# Patient Record
Sex: Female | Born: 1945 | Race: White | Hispanic: No | Marital: Married | Smoking: Never smoker
Health system: Southern US, Community
[De-identification: ages and names within clinical notes are randomized; demographics above are authoritative.]

## PROBLEM LIST (undated history)

## (undated) DIAGNOSIS — E7800 Pure hypercholesterolemia, unspecified: Secondary | ICD-10-CM

## (undated) DIAGNOSIS — I10 Essential (primary) hypertension: Secondary | ICD-10-CM

## (undated) DIAGNOSIS — R011 Cardiac murmur, unspecified: Secondary | ICD-10-CM

## (undated) HISTORY — PX: APPENDECTOMY: SHX54

## (undated) HISTORY — PX: ROTATOR CUFF REPAIR: SHX139

---

## 2012-03-21 ENCOUNTER — Encounter (HOSPITAL_BASED_OUTPATIENT_CLINIC_OR_DEPARTMENT_OTHER): Payer: Self-pay

## 2012-03-21 ENCOUNTER — Emergency Department (HOSPITAL_BASED_OUTPATIENT_CLINIC_OR_DEPARTMENT_OTHER)
Admission: EM | Admit: 2012-03-21 | Discharge: 2012-03-21 | Disposition: A | Discharge status: Home / Self Care | Payer: Medicare Other | Attending: Emergency Medicine | Admitting: Emergency Medicine

## 2012-03-21 DIAGNOSIS — Z79899 Other long term (current) drug therapy: Secondary | ICD-10-CM | POA: Insufficient documentation

## 2012-03-21 DIAGNOSIS — E7800 Pure hypercholesterolemia, unspecified: Secondary | ICD-10-CM | POA: Insufficient documentation

## 2012-03-21 DIAGNOSIS — M810 Age-related osteoporosis without current pathological fracture: Secondary | ICD-10-CM | POA: Insufficient documentation

## 2012-03-21 DIAGNOSIS — N390 Urinary tract infection, site not specified: Secondary | ICD-10-CM

## 2012-03-21 DIAGNOSIS — I10 Essential (primary) hypertension: Secondary | ICD-10-CM | POA: Insufficient documentation

## 2012-03-21 DIAGNOSIS — Z9889 Other specified postprocedural states: Secondary | ICD-10-CM | POA: Insufficient documentation

## 2012-03-21 DIAGNOSIS — R197 Diarrhea, unspecified: Secondary | ICD-10-CM | POA: Insufficient documentation

## 2012-03-21 HISTORY — DX: Cardiac murmur, unspecified: R01.1

## 2012-03-21 HISTORY — DX: Essential (primary) hypertension: I10

## 2012-03-21 HISTORY — DX: Pure hypercholesterolemia, unspecified: E78.00

## 2012-03-21 LAB — COMPREHENSIVE METABOLIC PANEL
ALT: 47 U/L — ABNORMAL HIGH (ref 0–35)
CO2: 25 mEq/L (ref 19–32)
Calcium: 9 mg/dL (ref 8.4–10.5)
Creatinine, Ser: 0.7 mg/dL (ref 0.50–1.10)
GFR calc Af Amer: >90 mL/min (ref >90)
GFR calc non Af Amer: 88 mL/min — ABNORMAL LOW (ref >90)
Glucose, Bld: 102 mg/dL — ABNORMAL HIGH (ref 70–99)
Total Bilirubin: 0.4 mg/dL (ref 0.3–1.2)

## 2012-03-21 LAB — URINALYSIS, ROUTINE W REFLEX MICROSCOPIC
Bilirubin Urine: NEGATIVE
Protein, ur: NEGATIVE mg/dL
Urobilinogen, UA: 0.2 mg/dL (ref 0.0–1.0)

## 2012-03-21 LAB — CBC
HCT: 40.3 % (ref 36.0–46.0)
Hemoglobin: 14.1 g/dL (ref 12.0–15.0)
MCH: 30.7 pg (ref 26.0–34.0)
MCHC: 35 g/dL (ref 30.0–36.0)

## 2012-03-21 LAB — DIFFERENTIAL
Basophils Relative: 1 % (ref 0–1)
Eosinophils Absolute: 0 10*3/uL (ref 0.0–0.7)
Eosinophils Relative: 0 % (ref 0–5)
Monocytes Absolute: 0.3 10*3/uL (ref 0.1–1.0)
Monocytes Relative: 7 % (ref 3–12)

## 2012-03-21 LAB — URINE MICROSCOPIC-ADD ON

## 2012-03-21 MED ORDER — SODIUM CHLORIDE 0.9 % IV BOLUS (SEPSIS)
1000.0000 mL | Freq: Once | INTRAVENOUS | Status: AC
  Administered 2012-03-21 (×2): 1000 mL via INTRAVENOUS

## 2012-03-21 MED ORDER — SODIUM CHLORIDE 0.9 % IV BOLUS (SEPSIS)
1000.0000 mL | Freq: Once | INTRAVENOUS | Status: AC
  Administered 2012-03-21: 1000 mL via INTRAVENOUS

## 2012-03-21 MED ORDER — CIPROFLOXACIN HCL 500 MG PO TABS
500.0000 mg | ORAL_TABLET | Freq: Once | ORAL | Status: AC
  Administered 2012-03-21: 500 mg via ORAL
  Filled 2012-03-21: qty 1

## 2012-03-21 MED ORDER — ACETAMINOPHEN 500 MG PO TABS
1000.0000 mg | ORAL_TABLET | Freq: Once | ORAL | Status: AC
  Administered 2012-03-21: 1000 mg via ORAL
  Filled 2012-03-21: qty 2

## 2012-03-21 MED ORDER — CIPROFLOXACIN HCL 500 MG PO TABS: 500.0000 mg | ORAL_TABLET | Freq: Two times a day (BID) | ORAL | Status: AC

## 2012-03-21 MED ORDER — ONDANSETRON HCL 4 MG/2ML IJ SOLN
4.0000 mg | Freq: Once | INTRAMUSCULAR | Status: AC
  Administered 2012-03-21: 4 mg via INTRAVENOUS
  Filled 2012-03-21: qty 2

## 2012-03-21 NOTE — Discharge Instructions (Signed)
Chronic Diarrhea Diarrhea is loose, watery stools. Having diarrhea means passing loose stools 3 or more times a day. Diarrhea that lasts longer than 4 weeks is considered long-lasting (chronic). Symptoms of chronic diarrhea may be continual or may come and go. People of all ages can get diarrhea. Body fluid loss (dehydration) may occur as a result of diarrhea. This means the body does not have as many fluids and salts (electrolytes) as it needs. CAUSES  There are many causes of chronic diarrhea. Causes may be different for children and adults. The various causes can be grouped into 2 categories: diarrhea caused by an infection and diarrhea not caused by an infection. Sometimes, the cause is unknown. Diarrhea caused by an infection may result from:  Parasites.   Bacteria.   Viral infections.  Diarrhea not caused by an infection may result from:  Irritable bowel syndrome.   Reaction to medicines, such as antibiotics, cancer drugs, blood pressure medicines, and antacids.   Intestinal disease (Crohn's disease, ulcerative colitis, celiac disease).   Food allergies or sensitivity to additives (fructose, lactose, sugar substitutes).   Tumors.   Diabetes, thyroid disease, and other endocrine diseases.   Reduced blood flow to the intestine.   Previous surgery or radiation of the abdomen or gastrointestinal tract.  Risk factors for chronic diarrhea include:  Having a severely weakened immune system, such as from HIV/AIDS.   Taking certain types of cancer-fighting drugs (chemotherapy) or other medicines.   A recent organ transplant.   Having a portion of the stomach removed.   Traveling to countries where food and water supplies are often contaminated.  SYMPTOMS  In addition to frequent, loose stools, diarrhea may cause:  Cramping.   Abdominal pain.   Nausea.   Urgent need to use the bathroom, or loss of bowel control.  If dehydration occurs, problems include:  Thirst.    Less frequent urination.   Dark urine.   Dry skin.   Fatigue.   Dizziness.  Infections that cause diarrhea may also cause a fever, chills, or bloody stools. DIAGNOSIS  Diagnosis may be difficult. Your caregiver must take a careful history and perform a physical exam. Tests given are based on your symptoms and history. Tests may include:  Blood or stool tests, in which 3 or more stool samples may be examined. Stool cultures may be used to test for bacteria or parasites.   X-rays.   A procedure in which a thin tube is inserted into the mouth or rectum (endoscopy). This allows the caregiver to look inside the intestine.  TREATMENT   Diarrhea caused by an infection can often be treated with antibiotics.   Diarrhea not caused by an infection is more difficult to diagnose and treat. Long-term medicine use or surgery may be required. Specific treatment should be discussed with your caregiver.   If the cause cannot determined, treatment to relieve symptoms includes:   Preventing dehydration. Serious health problems can occur if you do not maintain proper fluid levels. Many oral rehydration solutions (ORS) are available at drug stores. Ask your caregiver what product is best for you.   Not drinking beverages that contain caffeine (tea, coffee, soft drinks).   Not drinking alcohol. It causes dehydration.   Not relying on sports drinks and broths alone to maintain proper fluid levels. They should not be used to prevent severe dehydration.   Maintaining well-balanced nutrition. This may help you recover faster.  PREVENTION   Drink clean or purified water.   Use proper   food handling techniques.   Maintain proper hand-washing habits.  HOME CARE INSTRUCTIONS   Avoid:   Caffeine.   Greasy foods.   High fiber.   If you have problems digesting lactose during or after an episode of diarrhea, you might want to try yogurt. Yogurt is often better tolerated, because it has less  lactose than milk. Yogurt with active, live bacterial cultures may even help you recover faster.  SEEK MEDICAL CARE IF:  The person with diarrhea is an otherwise healthy adult and has:  Signs of dehydration.   Diarrhea for more than 2 days.   Severe pain in the abdomen or rectum.   An oral temperature above 102 F (38.9 C).   Stools containing blood or pus.   Stools that are black and tarry.  SEEK IMMEDIATE MEDICAL CARE IF:  The person with diarrhea is a child, elderly person, or has a weakened immune system and has:  Signs of dehydration.   Diarrhea for more than 1 day.   Severe pain in the abdomen or rectum.   An oral temperature above 102 F (38.9 C), not controlled by medicine.   Stools containing blood or pus.   Stools that are black and tarry.  Document Released: 02/19/2004 Document Revised: 11/18/2011 Document Reviewed: 04/17/2010 Exit[employer] Patient Information 2012 [employer]. 

## 2012-03-21 NOTE — ED Notes (Signed)
Per EDP, walked patient down the hall, retook BP 108/53. EDP notified

## 2012-03-21 NOTE — ED Notes (Signed)
Pt received 2000cc IV saline Not 3000cc as documented.

## 2012-03-21 NOTE — ED Notes (Signed)
Pt c/o diarrhea for past 5 weeks.  Pt states she saw [facility] and provided stool samples but hasn't heard back.  Pt states she tried Immodium but became constipated.  Pt contacted PCP and was told to come here to ED.

## 2012-03-21 NOTE — ED Notes (Signed)
Pt ambulatory to bathroom without difficulty.  

## 2012-03-21 NOTE — ED Notes (Signed)
C/o chronic diarrhea for 6 weeks. [facility] office told her to take imodium which made her constipated. Patient contacted [facility] gastroenterology office to give stool sample and took it to lab yesterday. Fever of  101 on Sunday and 102 today, weakness, fatigue, nausea started today.

## 2012-03-21 NOTE — ED Provider Notes (Signed)
History     CSN: [id number]  Arrival date & time 03/21/12  1722   First MD Initiated Contact with Patient 03/21/12 1750      Chief Complaint  Patient presents with  . Diarrhea    (Consider location/radiation/quality/duration/timing/severity/associated sxs/prior treatment) HPI The patient presents with concerns over ongoing diarrhea.  She notes that her symptoms began approximately 6 weeks ago.  She specifies that her change in stool has actually been a increasing frequency and a decrease in consistency with soft stool, but not watery diarrhea.  This happens up to 3 times daily, typically in the morning.  She has recently started trying Imodium, and notes a decrease in her symptoms.  Yesterday today the patient also had fever with maximum temperature 102.  His fever improved with OTC medication.,  The patient has been working with [facility] gastroenterology, and provided a stool sample yesterday.  She denies any significant abdominal pain, chest pain, dyspnea, confusion, disorientation.  She has no history of IBS/IBD/Crohn's.  Her daughter does have ulcerative colitis. Past Medical History  Diagnosis Date  . Osteoporosis   . Murmur, cardiac   . Hypertension   . Hypercholesteremia     Past Surgical History  Procedure Date  . Cesarean section   . Appendectomy   . Rotator cuff repair     No family history on file.  History  Substance Use Topics  . Smoking status: Never Smoker   . Smokeless tobacco: Not on file  . Alcohol Use: Yes    OB History    Grav Para Term Preterm Abortions TAB SAB Ect Mult Living                  Review of Systems  Constitutional:       HPI  HENT:       HPI otherwise negative  Eyes: Negative.   Respiratory:       HPI, otherwise negative  Cardiovascular:       HPI, otherwise nmegative  Gastrointestinal: Negative for vomiting.  Genitourinary:       HPI, otherwise negative  Musculoskeletal:       HPI, otherwise negative  Skin: Negative.     Neurological: Negative for syncope.    Allergies  Amoxicillin  Home Medications   Current Outpatient Rx  Name Route Sig Dispense Refill  . ALPRAZOLAM 0.5 MG PO TABS Oral Take 0.5 mg by mouth at bedtime as needed. For sleep    . ATENOLOL 25 MG PO TABS Oral Take 25 mg by mouth daily.    Marland Kitchen CALCIUM + D PO Oral Take 1 capsule by mouth 2 (two) times daily.    Marland Kitchen VITAMIN D 2000 UNITS PO TABS Oral Take 2,000 Units by mouth daily.    . CO Q-10 100 MG PO CAPS Oral Take 1 capsule by mouth daily.    . OMEGA-3 FATTY ACIDS 1000 MG PO CAPS Oral Take 1 g by mouth daily.    . LUTEIN PO Oral Take 1 tablet by mouth daily.    Marland Kitchen SIMVASTATIN 20 MG PO TABS Oral Take 20 mg by mouth at bedtime.    . TRAMADOL HCL 50 MG PO TABS Oral Take 50 mg by mouth 3 (three) times daily as needed. For pain or sleep      BP 127/55  Pulse 101  Temp(Src) 100.4 F (38 C) (Oral)  Resp 18  Ht 5\' 4"  (1.626 m)  Wt 105 lb (47.628 kg)  BMI 18.02 kg/m2  SpO2  100%  Physical Exam  Nursing note and vitals reviewed. Constitutional: She is oriented to person, place, and time. She appears well-developed and well-nourished. No distress.  HENT:  Head: Normocephalic and atraumatic.  Eyes: Conjunctivae and EOM are normal.  Cardiovascular: Normal rate and regular rhythm.   Pulmonary/Chest: Effort normal and breath sounds normal. No stridor. No respiratory distress.  Abdominal: She exhibits no distension.  Musculoskeletal: She exhibits no edema.  Neurological: She is alert and oriented to person, place, and time. No cranial nerve deficit.  Skin: Skin is warm and dry.  Psychiatric: She has a normal mood and affect.    ED Course  Procedures (including critical care time)  Labs Reviewed  CBC - Abnormal; Notable for the following:    WBC 3.5 (*)    All other components within normal limits  DIFFERENTIAL  URINALYSIS, ROUTINE W REFLEX MICROSCOPIC  LIPASE, BLOOD  COMPREHENSIVE METABOLIC PANEL   No results found.   No  diagnosis found.    MDM  This elderly female presents with chronic diarrhea.  On exam the patient is in no distress with soft abdomen and mild tachycardia.  Patient also and better with IV fluids.  The patient's labs notable for the absence of leukocytosis, though she is moderately febrile.  Patient's presentation is most consistent with IBS or colitis.  The patient's labs are otherwise notable for suggestions of a UTI, though this may be secondary to dehydration.  The patient felt better on repeat evaluation.  She is discharged in stable condition to follow up with her tremor care physician as well as her gastroenterologist.    Gerhard Munch, MD 03/21/12 2051

## 2014-08-26 DIAGNOSIS — M810 Age-related osteoporosis without current pathological fracture: Secondary | ICD-10-CM | POA: Insufficient documentation

## 2014-08-26 DIAGNOSIS — R000 Tachycardia, unspecified: Secondary | ICD-10-CM | POA: Insufficient documentation

## 2014-08-26 DIAGNOSIS — IMO0001 Reserved for inherently not codable concepts without codable children: Secondary | ICD-10-CM | POA: Insufficient documentation

## 2014-08-26 DIAGNOSIS — G629 Polyneuropathy, unspecified: Secondary | ICD-10-CM | POA: Insufficient documentation

## 2014-08-26 DIAGNOSIS — M545 Low back pain, unspecified: Secondary | ICD-10-CM | POA: Insufficient documentation

## 2014-08-26 DIAGNOSIS — F4323 Adjustment disorder with mixed anxiety and depressed mood: Secondary | ICD-10-CM | POA: Insufficient documentation

## 2014-08-26 DIAGNOSIS — M62830 Muscle spasm of back: Secondary | ICD-10-CM | POA: Insufficient documentation

## 2016-07-16 DIAGNOSIS — E785 Hyperlipidemia, unspecified: Secondary | ICD-10-CM | POA: Insufficient documentation

## 2016-08-17 DIAGNOSIS — R002 Palpitations: Secondary | ICD-10-CM | POA: Insufficient documentation

## 2016-08-18 ENCOUNTER — Ambulatory Visit: Payer: Medicare Other

## 2017-03-11 DIAGNOSIS — H2513 Age-related nuclear cataract, bilateral: Secondary | ICD-10-CM | POA: Insufficient documentation

## 2017-03-11 DIAGNOSIS — H353190 Nonexudative age-related macular degeneration, unspecified eye, stage unspecified: Secondary | ICD-10-CM | POA: Insufficient documentation

## 2017-03-11 DIAGNOSIS — H43813 Vitreous degeneration, bilateral: Secondary | ICD-10-CM | POA: Insufficient documentation

## 2017-03-11 DIAGNOSIS — H524 Presbyopia: Secondary | ICD-10-CM | POA: Insufficient documentation

## 2017-07-08 DIAGNOSIS — H02831 Dermatochalasis of right upper eyelid: Secondary | ICD-10-CM | POA: Insufficient documentation

## 2018-12-26 DIAGNOSIS — F419 Anxiety disorder, unspecified: Secondary | ICD-10-CM | POA: Insufficient documentation

## 2019-10-25 DIAGNOSIS — H6123 Impacted cerumen, bilateral: Secondary | ICD-10-CM | POA: Insufficient documentation

## 2019-10-25 DIAGNOSIS — H93293 Other abnormal auditory perceptions, bilateral: Secondary | ICD-10-CM | POA: Insufficient documentation

## 2020-04-11 DIAGNOSIS — H35372 Puckering of macula, left eye: Secondary | ICD-10-CM | POA: Insufficient documentation

## 2020-12-23 DIAGNOSIS — H02834 Dermatochalasis of left upper eyelid: Secondary | ICD-10-CM | POA: Diagnosis not present

## 2020-12-23 DIAGNOSIS — H02831 Dermatochalasis of right upper eyelid: Secondary | ICD-10-CM | POA: Diagnosis not present

## 2020-12-31 DIAGNOSIS — L821 Other seborrheic keratosis: Secondary | ICD-10-CM | POA: Diagnosis not present

## 2020-12-31 DIAGNOSIS — L820 Inflamed seborrheic keratosis: Secondary | ICD-10-CM | POA: Diagnosis not present

## 2020-12-31 DIAGNOSIS — Z85828 Personal history of other malignant neoplasm of skin: Secondary | ICD-10-CM | POA: Diagnosis not present

## 2020-12-31 DIAGNOSIS — D485 Neoplasm of uncertain behavior of skin: Secondary | ICD-10-CM | POA: Diagnosis not present

## 2021-01-20 DIAGNOSIS — H02831 Dermatochalasis of right upper eyelid: Secondary | ICD-10-CM | POA: Diagnosis not present

## 2021-01-20 DIAGNOSIS — H02834 Dermatochalasis of left upper eyelid: Secondary | ICD-10-CM | POA: Diagnosis not present

## 2021-02-13 DIAGNOSIS — R351 Nocturia: Secondary | ICD-10-CM | POA: Diagnosis not present

## 2021-04-06 DIAGNOSIS — H02831 Dermatochalasis of right upper eyelid: Secondary | ICD-10-CM | POA: Diagnosis not present

## 2021-04-06 DIAGNOSIS — H02834 Dermatochalasis of left upper eyelid: Secondary | ICD-10-CM | POA: Diagnosis not present

## 2021-04-06 DIAGNOSIS — H5340 Unspecified visual field defects: Secondary | ICD-10-CM | POA: Diagnosis not present

## 2021-04-27 DIAGNOSIS — R030 Elevated blood-pressure reading, without diagnosis of hypertension: Secondary | ICD-10-CM | POA: Diagnosis not present

## 2021-04-27 DIAGNOSIS — R195 Other fecal abnormalities: Secondary | ICD-10-CM | POA: Diagnosis not present

## 2021-05-13 DIAGNOSIS — K52832 Lymphocytic colitis: Secondary | ICD-10-CM | POA: Diagnosis not present

## 2021-05-13 DIAGNOSIS — R195 Other fecal abnormalities: Secondary | ICD-10-CM | POA: Diagnosis not present

## 2021-05-13 DIAGNOSIS — I10 Essential (primary) hypertension: Secondary | ICD-10-CM | POA: Diagnosis not present

## 2021-05-21 DIAGNOSIS — H353132 Nonexudative age-related macular degeneration, bilateral, intermediate dry stage: Secondary | ICD-10-CM | POA: Diagnosis not present

## 2021-05-21 DIAGNOSIS — H35372 Puckering of macula, left eye: Secondary | ICD-10-CM | POA: Diagnosis not present

## 2021-05-25 DIAGNOSIS — R000 Tachycardia, unspecified: Secondary | ICD-10-CM | POA: Diagnosis not present

## 2021-05-25 DIAGNOSIS — R011 Cardiac murmur, unspecified: Secondary | ICD-10-CM | POA: Insufficient documentation

## 2021-05-25 DIAGNOSIS — R69 Illness, unspecified: Secondary | ICD-10-CM | POA: Diagnosis not present

## 2021-05-25 DIAGNOSIS — R002 Palpitations: Secondary | ICD-10-CM | POA: Diagnosis not present

## 2021-06-24 DIAGNOSIS — R195 Other fecal abnormalities: Secondary | ICD-10-CM | POA: Diagnosis not present

## 2021-06-24 DIAGNOSIS — R000 Tachycardia, unspecified: Secondary | ICD-10-CM | POA: Diagnosis not present

## 2021-06-24 DIAGNOSIS — R69 Illness, unspecified: Secondary | ICD-10-CM | POA: Diagnosis not present

## 2021-07-21 DIAGNOSIS — R011 Cardiac murmur, unspecified: Secondary | ICD-10-CM | POA: Diagnosis not present

## 2021-07-23 DIAGNOSIS — R351 Nocturia: Secondary | ICD-10-CM | POA: Diagnosis not present

## 2021-08-10 DIAGNOSIS — K52832 Lymphocytic colitis: Secondary | ICD-10-CM | POA: Diagnosis not present

## 2021-08-10 DIAGNOSIS — R198 Other specified symptoms and signs involving the digestive system and abdomen: Secondary | ICD-10-CM | POA: Diagnosis not present

## 2021-08-19 DIAGNOSIS — I739 Peripheral vascular disease, unspecified: Secondary | ICD-10-CM | POA: Diagnosis not present

## 2021-08-19 DIAGNOSIS — E785 Hyperlipidemia, unspecified: Secondary | ICD-10-CM | POA: Diagnosis not present

## 2021-08-19 DIAGNOSIS — M810 Age-related osteoporosis without current pathological fracture: Secondary | ICD-10-CM | POA: Diagnosis not present

## 2021-08-19 DIAGNOSIS — N3281 Overactive bladder: Secondary | ICD-10-CM | POA: Diagnosis not present

## 2021-08-19 DIAGNOSIS — Z7722 Contact with and (suspected) exposure to environmental tobacco smoke (acute) (chronic): Secondary | ICD-10-CM | POA: Diagnosis not present

## 2021-08-19 DIAGNOSIS — R69 Illness, unspecified: Secondary | ICD-10-CM | POA: Diagnosis not present

## 2021-08-19 DIAGNOSIS — Z803 Family history of malignant neoplasm of breast: Secondary | ICD-10-CM | POA: Diagnosis not present

## 2021-08-19 DIAGNOSIS — H3530 Unspecified macular degeneration: Secondary | ICD-10-CM | POA: Diagnosis not present

## 2021-08-19 DIAGNOSIS — R030 Elevated blood-pressure reading, without diagnosis of hypertension: Secondary | ICD-10-CM | POA: Diagnosis not present

## 2021-08-19 DIAGNOSIS — Z008 Encounter for other general examination: Secondary | ICD-10-CM | POA: Diagnosis not present

## 2021-08-19 DIAGNOSIS — Z8249 Family history of ischemic heart disease and other diseases of the circulatory system: Secondary | ICD-10-CM | POA: Diagnosis not present

## 2021-08-19 DIAGNOSIS — Z7982 Long term (current) use of aspirin: Secondary | ICD-10-CM | POA: Diagnosis not present

## 2021-08-26 DIAGNOSIS — E782 Mixed hyperlipidemia: Secondary | ICD-10-CM | POA: Diagnosis not present

## 2021-08-26 DIAGNOSIS — M810 Age-related osteoporosis without current pathological fracture: Secondary | ICD-10-CM | POA: Diagnosis not present

## 2021-09-07 ENCOUNTER — Other Ambulatory Visit: Payer: Self-pay

## 2021-09-07 ENCOUNTER — Encounter (HOSPITAL_BASED_OUTPATIENT_CLINIC_OR_DEPARTMENT_OTHER): Payer: Self-pay | Admitting: Urology

## 2021-09-07 ENCOUNTER — Emergency Department (HOSPITAL_BASED_OUTPATIENT_CLINIC_OR_DEPARTMENT_OTHER)
Admission: EM | Admit: 2021-09-07 | Discharge: 2021-09-07 | Disposition: A | Discharge status: Home / Self Care | Payer: Medicare HMO | Attending: Emergency Medicine | Admitting: Emergency Medicine

## 2021-09-07 ENCOUNTER — Emergency Department (HOSPITAL_BASED_OUTPATIENT_CLINIC_OR_DEPARTMENT_OTHER): Payer: Medicare HMO

## 2021-09-07 DIAGNOSIS — U071 COVID-19: Secondary | ICD-10-CM | POA: Diagnosis not present

## 2021-09-07 DIAGNOSIS — R059 Cough, unspecified: Secondary | ICD-10-CM | POA: Diagnosis not present

## 2021-09-07 DIAGNOSIS — I10 Essential (primary) hypertension: Secondary | ICD-10-CM | POA: Diagnosis not present

## 2021-09-07 LAB — RESP PANEL BY RT-PCR (FLU A&B, COVID) ARPGX2
Influenza A by PCR: NEGATIVE
Influenza B by PCR: NEGATIVE
SARS Coronavirus 2 by RT PCR: POSITIVE — AB

## 2021-09-07 MED ORDER — ALBUTEROL SULFATE HFA 108 (90 BASE) MCG/ACT IN AERS: 1.0000 | INHALATION_SPRAY | Freq: Four times a day (QID) | RESPIRATORY_TRACT | 0 refills | Status: DC | PRN

## 2021-09-07 MED ORDER — NIRMATRELVIR/RITONAVIR (PAXLOVID)TABLET: 3.0000 | ORAL_TABLET | Freq: Two times a day (BID) | ORAL | 0 refills | Status: AC

## 2021-09-07 NOTE — Discharge Instructions (Addendum)
You were seen in the emergency department today with COVID-19 symptoms.  You have tested positive for COVID-19.  Please take the antiviral medication as prescribed.  While taking this medication, you should stop taking your Zocor.  You can restart this after you have completed your antiviral medicines.  Please remain in quarantine and follow closely with your primary care.  Return to the emergency department with any new or suddenly worsening symptoms.

## 2021-09-07 NOTE — ED Provider Notes (Signed)
Emergency Department Provider Note   I have reviewed the triage vital signs and the nursing notes.   HISTORY  Chief Complaint Cough   HPI Sherri Parks is a 75 y.o. female with PMH reviewed below presents to the ED with cough and congestion symptoms. Patient has a husband sick with similar illness. No CP or SOB. Appetite is somewhat decreased. Energy ok to slightly diminished. No vomiting or diarrhea symptoms. No abdominal pain. Patient denies known sick contacts other than her husband. No radiation of symptoms or modifying factors. Patient's symptoms ongoing for the last 1-2 days.    Past Medical History:  Diagnosis Date   Hypercholesteremia    Hypertension    Murmur, cardiac    Osteoporosis     There are no problems to display for this patient.   Past Surgical History:  Procedure Laterality Date   APPENDECTOMY     CESAREAN SECTION     ROTATOR CUFF REPAIR      Allergies Amoxicillin  History reviewed. No pertinent family history.  Social History Social History   Tobacco Use   Smoking status: Never  Substance Use Topics   Alcohol use: Yes   Drug use: No    Review of Systems  Constitutional: Subjective fever/chills with body aches.  Eyes: No visual changes. ENT: Mild sore throat. Cardiovascular: Denies chest pain. Respiratory: Denies shortness of breath. Positive cough.  Gastrointestinal: No abdominal pain.  No nausea, no vomiting.  No diarrhea.  No constipation. Genitourinary: Negative for dysuria. Musculoskeletal: Negative for back pain. Skin: Negative for rash. Neurological: Negative for focal weakness or numbness. Positive HA.   10-point ROS otherwise negative.  ____________________________________________   PHYSICAL EXAM:  VITAL SIGNS: ED Triage Vitals  Enc Vitals Group     BP 09/07/21 1020 140/77     Pulse Rate 09/07/21 1020 (!) 111     Resp 09/07/21 1020 18     Temp 09/07/21 1020 98.5 F (36.9 C)     Temp Source 09/07/21 1020 Oral      SpO2 09/07/21 1020 99 %     Weight 09/07/21 1018 92 lb (41.7 kg)     Height 09/07/21 1018 5\' 4"  (1.626 m)   Constitutional: Alert and oriented. Well appearing and in no acute distress. Eyes: Conjunctivae are normal Head: Atraumatic. Nose: Positive congestion/rhinnorhea. Mouth/Throat: Mucous membranes are moist.  Oropharynx non-erythematous. Neck: No stridor.   Cardiovascular: Normal rate, regular rhythm. Good peripheral circulation. Grossly normal heart sounds.   Respiratory: Normal respiratory effort.  No retractions. Lungs CTAB. Gastrointestinal: Soft and nontender. No distention.  Musculoskeletal: No lower extremity tenderness nor edema. No gross deformities of extremities. Neurologic:  Normal speech and language. No gross focal neurologic deficits are appreciated.  Skin:  Skin is warm, dry and intact. No rash noted.   ____________________________________________   LABS (all labs ordered are listed, but only abnormal results are displayed)  Labs Reviewed  RESP PANEL BY RT-PCR (FLU A&B, COVID) ARPGX2 - Abnormal; Notable for the following components:      Result Value   SARS Coronavirus 2 by RT PCR POSITIVE (*)    All other components within normal limits    ____________________________________________  RADIOLOGY  DG Chest Portable 1 View  Result Date: 09/07/2021 CLINICAL DATA:  Cough EXAM: PORTABLE CHEST 1 VIEW COMPARISON:  None. FINDINGS: The heart size and mediastinal contours are within normal limits. Both lungs are clear. No pleural effusion or pneumothorax. The visualized skeletal structures are unremarkable. IMPRESSION: No acute process in  the chest. Electronically Signed   By: Macy Mis M.D.   On: 09/07/2021 11:38    ____________________________________________   PROCEDURES  Procedure(s) performed:   Procedures  None ____________________________________________   INITIAL IMPRESSION / ASSESSMENT AND PLAN / ED COURSE  Pertinent labs & imaging  results that were available during my care of the patient were reviewed by me and considered in my medical decision making (see chart for details).   Patient presents to the ED with moderate flu-like symptoms. No increased WOB or abnormal vitals to suspect impending respiratory failure or sepsis. COVID test is positive here. Husband here with similar diagnosis. Patient is at risk for worsening disease and consents to Paxlovid. Patient has chemistry from last week available for review in Care Everywhere showing normal kidney function. Will prescribe paxlovid and plan for supportive care at home with quarantine and return with any new/worsening symptoms.    ____________________________________________  FINAL CLINICAL IMPRESSION(S) / ED DIAGNOSES  Final diagnoses:  COVID-19     NEW OUTPATIENT MEDICATIONS STARTED DURING THIS VISIT:  Discharge Medication List as of 09/07/2021 11:48 AM     START taking these medications   Details  albuterol (VENTOLIN HFA) 108 (90 Base) MCG/ACT inhaler Inhale 1-2 puffs into the lungs every 6 (six) hours as needed for wheezing or shortness of breath., Starting Mon 09/07/2021, Normal    nirmatrelvir/ritonavir EUA (PAXLOVID) 20 x 150 MG & 10 x 100MG  TABS Take 3 tablets by mouth 2 (two) times daily for 5 days. Patient GFR is >60. Take nirmatrelvir (150 mg) two tablets twice daily for 5 days and ritonavir (100 mg) one tablet twice daily for 5 days., Starting Mon 09/07/2021, Until Sat 09/12/2021, Normal        Note:  This document was prepared using Dragon voice recognition software and may include unintentional dictation errors.  Nanda Quinton, MD, [facility]    Ximena Todaro, [facility], MD 09/11/21 [phone]

## 2021-09-07 NOTE — ED Triage Notes (Signed)
Cough and runny nose x 3 days.  Denies fever.

## 2021-09-09 DIAGNOSIS — N3289 Other specified disorders of bladder: Secondary | ICD-10-CM | POA: Diagnosis not present

## 2021-09-09 DIAGNOSIS — R42 Dizziness and giddiness: Secondary | ICD-10-CM | POA: Diagnosis not present

## 2021-09-09 DIAGNOSIS — E871 Hypo-osmolality and hyponatremia: Secondary | ICD-10-CM | POA: Diagnosis not present

## 2021-09-09 DIAGNOSIS — R197 Diarrhea, unspecified: Secondary | ICD-10-CM | POA: Diagnosis not present

## 2021-09-09 DIAGNOSIS — U071 COVID-19: Secondary | ICD-10-CM | POA: Diagnosis not present

## 2021-09-09 DIAGNOSIS — R531 Weakness: Secondary | ICD-10-CM | POA: Diagnosis not present

## 2021-09-09 DIAGNOSIS — R112 Nausea with vomiting, unspecified: Secondary | ICD-10-CM | POA: Diagnosis not present

## 2021-09-09 DIAGNOSIS — N1330 Unspecified hydronephrosis: Secondary | ICD-10-CM | POA: Diagnosis not present

## 2021-09-16 DIAGNOSIS — B9561 Methicillin susceptible Staphylococcus aureus infection as the cause of diseases classified elsewhere: Secondary | ICD-10-CM | POA: Diagnosis not present

## 2021-09-16 DIAGNOSIS — E871 Hypo-osmolality and hyponatremia: Secondary | ICD-10-CM | POA: Diagnosis not present

## 2021-09-16 DIAGNOSIS — E854 Organ-limited amyloidosis: Secondary | ICD-10-CM | POA: Diagnosis not present

## 2021-09-16 DIAGNOSIS — I611 Nontraumatic intracerebral hemorrhage in hemisphere, cortical: Secondary | ICD-10-CM | POA: Diagnosis not present

## 2021-09-16 DIAGNOSIS — R339 Retention of urine, unspecified: Secondary | ICD-10-CM | POA: Diagnosis not present

## 2021-09-16 DIAGNOSIS — I10 Essential (primary) hypertension: Secondary | ICD-10-CM | POA: Diagnosis not present

## 2021-09-16 DIAGNOSIS — Z7982 Long term (current) use of aspirin: Secondary | ICD-10-CM | POA: Diagnosis not present

## 2021-09-16 DIAGNOSIS — I680 Cerebral amyloid angiopathy: Secondary | ICD-10-CM | POA: Diagnosis not present

## 2021-09-16 DIAGNOSIS — G939 Disorder of brain, unspecified: Secondary | ICD-10-CM | POA: Diagnosis not present

## 2021-09-16 DIAGNOSIS — Z8249 Family history of ischemic heart disease and other diseases of the circulatory system: Secondary | ICD-10-CM | POA: Diagnosis not present

## 2021-09-16 DIAGNOSIS — N390 Urinary tract infection, site not specified: Secondary | ICD-10-CM | POA: Diagnosis not present

## 2021-09-16 DIAGNOSIS — U071 COVID-19: Secondary | ICD-10-CM | POA: Diagnosis not present

## 2021-09-16 DIAGNOSIS — I629 Nontraumatic intracranial hemorrhage, unspecified: Secondary | ICD-10-CM | POA: Diagnosis not present

## 2021-09-16 DIAGNOSIS — E785 Hyperlipidemia, unspecified: Secondary | ICD-10-CM | POA: Diagnosis not present

## 2021-09-16 DIAGNOSIS — K635 Polyp of colon: Secondary | ICD-10-CM | POA: Diagnosis not present

## 2021-09-16 DIAGNOSIS — I2510 Atherosclerotic heart disease of native coronary artery without angina pectoris: Secondary | ICD-10-CM | POA: Diagnosis not present

## 2021-09-16 DIAGNOSIS — M1990 Unspecified osteoarthritis, unspecified site: Secondary | ICD-10-CM | POA: Diagnosis not present

## 2021-09-16 DIAGNOSIS — S06350A Traumatic hemorrhage of left cerebrum without loss of consciousness, initial encounter: Secondary | ICD-10-CM | POA: Diagnosis not present

## 2021-09-16 DIAGNOSIS — Z820 Family history of epilepsy and other diseases of the nervous system: Secondary | ICD-10-CM | POA: Diagnosis not present

## 2021-09-16 DIAGNOSIS — R42 Dizziness and giddiness: Secondary | ICD-10-CM | POA: Diagnosis not present

## 2021-09-16 DIAGNOSIS — R69 Illness, unspecified: Secondary | ICD-10-CM | POA: Diagnosis not present

## 2021-09-16 DIAGNOSIS — R531 Weakness: Secondary | ICD-10-CM | POA: Diagnosis not present

## 2021-09-16 DIAGNOSIS — N136 Pyonephrosis: Secondary | ICD-10-CM | POA: Diagnosis not present

## 2021-09-21 DIAGNOSIS — R42 Dizziness and giddiness: Secondary | ICD-10-CM | POA: Insufficient documentation

## 2021-09-22 DIAGNOSIS — E871 Hypo-osmolality and hyponatremia: Secondary | ICD-10-CM | POA: Diagnosis not present

## 2021-09-23 DIAGNOSIS — I619 Nontraumatic intracerebral hemorrhage, unspecified: Secondary | ICD-10-CM | POA: Diagnosis not present

## 2021-09-23 DIAGNOSIS — R42 Dizziness and giddiness: Secondary | ICD-10-CM | POA: Diagnosis not present

## 2021-09-23 DIAGNOSIS — E871 Hypo-osmolality and hyponatremia: Secondary | ICD-10-CM | POA: Diagnosis not present

## 2021-09-23 DIAGNOSIS — U071 COVID-19: Secondary | ICD-10-CM | POA: Diagnosis not present

## 2021-09-23 DIAGNOSIS — J849 Interstitial pulmonary disease, unspecified: Secondary | ICD-10-CM | POA: Diagnosis not present

## 2021-09-24 DIAGNOSIS — R339 Retention of urine, unspecified: Secondary | ICD-10-CM | POA: Diagnosis not present

## 2021-09-24 DIAGNOSIS — M6281 Muscle weakness (generalized): Secondary | ICD-10-CM | POA: Diagnosis not present

## 2021-09-24 DIAGNOSIS — H43813 Vitreous degeneration, bilateral: Secondary | ICD-10-CM | POA: Diagnosis not present

## 2021-09-24 DIAGNOSIS — M810 Age-related osteoporosis without current pathological fracture: Secondary | ICD-10-CM | POA: Diagnosis not present

## 2021-09-24 DIAGNOSIS — I2510 Atherosclerotic heart disease of native coronary artery without angina pectoris: Secondary | ICD-10-CM | POA: Diagnosis not present

## 2021-09-24 DIAGNOSIS — M791 Myalgia, unspecified site: Secondary | ICD-10-CM | POA: Diagnosis not present

## 2021-09-24 DIAGNOSIS — N390 Urinary tract infection, site not specified: Secondary | ICD-10-CM | POA: Diagnosis not present

## 2021-09-24 DIAGNOSIS — M62830 Muscle spasm of back: Secondary | ICD-10-CM | POA: Diagnosis not present

## 2021-09-24 DIAGNOSIS — M1990 Unspecified osteoarthritis, unspecified site: Secondary | ICD-10-CM | POA: Diagnosis not present

## 2021-09-24 DIAGNOSIS — G629 Polyneuropathy, unspecified: Secondary | ICD-10-CM | POA: Diagnosis not present

## 2021-09-24 DIAGNOSIS — R42 Dizziness and giddiness: Secondary | ICD-10-CM | POA: Diagnosis not present

## 2021-09-24 DIAGNOSIS — E871 Hypo-osmolality and hyponatremia: Secondary | ICD-10-CM | POA: Diagnosis not present

## 2021-09-24 DIAGNOSIS — K529 Noninfective gastroenteritis and colitis, unspecified: Secondary | ICD-10-CM | POA: Diagnosis not present

## 2021-09-24 DIAGNOSIS — H353132 Nonexudative age-related macular degeneration, bilateral, intermediate dry stage: Secondary | ICD-10-CM | POA: Diagnosis not present

## 2021-09-24 DIAGNOSIS — Z86010 Personal history of colonic polyps: Secondary | ICD-10-CM | POA: Diagnosis not present

## 2021-09-24 DIAGNOSIS — I10 Essential (primary) hypertension: Secondary | ICD-10-CM | POA: Diagnosis not present

## 2021-09-24 DIAGNOSIS — F4323 Adjustment disorder with mixed anxiety and depressed mood: Secondary | ICD-10-CM | POA: Diagnosis not present

## 2021-09-24 DIAGNOSIS — Z792 Long term (current) use of antibiotics: Secondary | ICD-10-CM | POA: Diagnosis not present

## 2021-09-24 DIAGNOSIS — H02831 Dermatochalasis of right upper eyelid: Secondary | ICD-10-CM | POA: Diagnosis not present

## 2021-09-24 DIAGNOSIS — E559 Vitamin D deficiency, unspecified: Secondary | ICD-10-CM | POA: Diagnosis not present

## 2021-09-24 DIAGNOSIS — U071 COVID-19: Secondary | ICD-10-CM | POA: Diagnosis not present

## 2021-09-24 DIAGNOSIS — H02834 Dermatochalasis of left upper eyelid: Secondary | ICD-10-CM | POA: Diagnosis not present

## 2021-09-24 DIAGNOSIS — E7800 Pure hypercholesterolemia, unspecified: Secondary | ICD-10-CM | POA: Diagnosis not present

## 2021-09-24 DIAGNOSIS — H6123 Impacted cerumen, bilateral: Secondary | ICD-10-CM | POA: Diagnosis not present

## 2021-09-28 DIAGNOSIS — E871 Hypo-osmolality and hyponatremia: Secondary | ICD-10-CM | POA: Diagnosis not present

## 2021-09-28 DIAGNOSIS — R42 Dizziness and giddiness: Secondary | ICD-10-CM | POA: Diagnosis not present

## 2021-09-28 DIAGNOSIS — R195 Other fecal abnormalities: Secondary | ICD-10-CM | POA: Diagnosis not present

## 2021-09-28 DIAGNOSIS — Z87440 Personal history of urinary (tract) infections: Secondary | ICD-10-CM | POA: Diagnosis not present

## 2021-09-28 DIAGNOSIS — I10 Essential (primary) hypertension: Secondary | ICD-10-CM | POA: Diagnosis not present

## 2021-09-29 DIAGNOSIS — Z1231 Encounter for screening mammogram for malignant neoplasm of breast: Secondary | ICD-10-CM | POA: Diagnosis not present

## 2021-09-30 DIAGNOSIS — Z86010 Personal history of colonic polyps: Secondary | ICD-10-CM | POA: Diagnosis not present

## 2021-09-30 DIAGNOSIS — H02831 Dermatochalasis of right upper eyelid: Secondary | ICD-10-CM | POA: Diagnosis not present

## 2021-09-30 DIAGNOSIS — N390 Urinary tract infection, site not specified: Secondary | ICD-10-CM | POA: Diagnosis not present

## 2021-09-30 DIAGNOSIS — M810 Age-related osteoporosis without current pathological fracture: Secondary | ICD-10-CM | POA: Diagnosis not present

## 2021-09-30 DIAGNOSIS — H02834 Dermatochalasis of left upper eyelid: Secondary | ICD-10-CM | POA: Diagnosis not present

## 2021-09-30 DIAGNOSIS — R339 Retention of urine, unspecified: Secondary | ICD-10-CM | POA: Diagnosis not present

## 2021-09-30 DIAGNOSIS — U071 COVID-19: Secondary | ICD-10-CM | POA: Diagnosis not present

## 2021-09-30 DIAGNOSIS — G629 Polyneuropathy, unspecified: Secondary | ICD-10-CM | POA: Diagnosis not present

## 2021-09-30 DIAGNOSIS — F4323 Adjustment disorder with mixed anxiety and depressed mood: Secondary | ICD-10-CM | POA: Diagnosis not present

## 2021-09-30 DIAGNOSIS — M1990 Unspecified osteoarthritis, unspecified site: Secondary | ICD-10-CM | POA: Diagnosis not present

## 2021-09-30 DIAGNOSIS — E559 Vitamin D deficiency, unspecified: Secondary | ICD-10-CM | POA: Diagnosis not present

## 2021-09-30 DIAGNOSIS — H353132 Nonexudative age-related macular degeneration, bilateral, intermediate dry stage: Secondary | ICD-10-CM | POA: Diagnosis not present

## 2021-09-30 DIAGNOSIS — M791 Myalgia, unspecified site: Secondary | ICD-10-CM | POA: Diagnosis not present

## 2021-09-30 DIAGNOSIS — I2510 Atherosclerotic heart disease of native coronary artery without angina pectoris: Secondary | ICD-10-CM | POA: Diagnosis not present

## 2021-09-30 DIAGNOSIS — I10 Essential (primary) hypertension: Secondary | ICD-10-CM | POA: Diagnosis not present

## 2021-09-30 DIAGNOSIS — Z792 Long term (current) use of antibiotics: Secondary | ICD-10-CM | POA: Diagnosis not present

## 2021-09-30 DIAGNOSIS — M6281 Muscle weakness (generalized): Secondary | ICD-10-CM | POA: Diagnosis not present

## 2021-09-30 DIAGNOSIS — K529 Noninfective gastroenteritis and colitis, unspecified: Secondary | ICD-10-CM | POA: Diagnosis not present

## 2021-09-30 DIAGNOSIS — E7800 Pure hypercholesterolemia, unspecified: Secondary | ICD-10-CM | POA: Diagnosis not present

## 2021-09-30 DIAGNOSIS — E871 Hypo-osmolality and hyponatremia: Secondary | ICD-10-CM | POA: Diagnosis not present

## 2021-09-30 DIAGNOSIS — H6123 Impacted cerumen, bilateral: Secondary | ICD-10-CM | POA: Diagnosis not present

## 2021-09-30 DIAGNOSIS — M62830 Muscle spasm of back: Secondary | ICD-10-CM | POA: Diagnosis not present

## 2021-09-30 DIAGNOSIS — H43813 Vitreous degeneration, bilateral: Secondary | ICD-10-CM | POA: Diagnosis not present

## 2021-10-01 DIAGNOSIS — E871 Hypo-osmolality and hyponatremia: Secondary | ICD-10-CM | POA: Diagnosis not present

## 2021-10-01 DIAGNOSIS — K529 Noninfective gastroenteritis and colitis, unspecified: Secondary | ICD-10-CM | POA: Diagnosis not present

## 2021-10-01 DIAGNOSIS — H43813 Vitreous degeneration, bilateral: Secondary | ICD-10-CM | POA: Diagnosis not present

## 2021-10-01 DIAGNOSIS — Z792 Long term (current) use of antibiotics: Secondary | ICD-10-CM | POA: Diagnosis not present

## 2021-10-01 DIAGNOSIS — H353132 Nonexudative age-related macular degeneration, bilateral, intermediate dry stage: Secondary | ICD-10-CM | POA: Diagnosis not present

## 2021-10-01 DIAGNOSIS — I2510 Atherosclerotic heart disease of native coronary artery without angina pectoris: Secondary | ICD-10-CM | POA: Diagnosis not present

## 2021-10-01 DIAGNOSIS — M1990 Unspecified osteoarthritis, unspecified site: Secondary | ICD-10-CM | POA: Diagnosis not present

## 2021-10-01 DIAGNOSIS — F4323 Adjustment disorder with mixed anxiety and depressed mood: Secondary | ICD-10-CM | POA: Diagnosis not present

## 2021-10-01 DIAGNOSIS — I10 Essential (primary) hypertension: Secondary | ICD-10-CM | POA: Diagnosis not present

## 2021-10-01 DIAGNOSIS — E559 Vitamin D deficiency, unspecified: Secondary | ICD-10-CM | POA: Diagnosis not present

## 2021-10-01 DIAGNOSIS — H02831 Dermatochalasis of right upper eyelid: Secondary | ICD-10-CM | POA: Diagnosis not present

## 2021-10-01 DIAGNOSIS — H02834 Dermatochalasis of left upper eyelid: Secondary | ICD-10-CM | POA: Diagnosis not present

## 2021-10-01 DIAGNOSIS — M62830 Muscle spasm of back: Secondary | ICD-10-CM | POA: Diagnosis not present

## 2021-10-01 DIAGNOSIS — G629 Polyneuropathy, unspecified: Secondary | ICD-10-CM | POA: Diagnosis not present

## 2021-10-01 DIAGNOSIS — N390 Urinary tract infection, site not specified: Secondary | ICD-10-CM | POA: Diagnosis not present

## 2021-10-01 DIAGNOSIS — U071 COVID-19: Secondary | ICD-10-CM | POA: Diagnosis not present

## 2021-10-01 DIAGNOSIS — R339 Retention of urine, unspecified: Secondary | ICD-10-CM | POA: Diagnosis not present

## 2021-10-01 DIAGNOSIS — H6123 Impacted cerumen, bilateral: Secondary | ICD-10-CM | POA: Diagnosis not present

## 2021-10-01 DIAGNOSIS — Z86010 Personal history of colonic polyps: Secondary | ICD-10-CM | POA: Diagnosis not present

## 2021-10-01 DIAGNOSIS — E7800 Pure hypercholesterolemia, unspecified: Secondary | ICD-10-CM | POA: Diagnosis not present

## 2021-10-01 DIAGNOSIS — M791 Myalgia, unspecified site: Secondary | ICD-10-CM | POA: Diagnosis not present

## 2021-10-01 DIAGNOSIS — M810 Age-related osteoporosis without current pathological fracture: Secondary | ICD-10-CM | POA: Diagnosis not present

## 2021-10-01 DIAGNOSIS — M6281 Muscle weakness (generalized): Secondary | ICD-10-CM | POA: Diagnosis not present

## 2021-10-05 DIAGNOSIS — Z780 Asymptomatic menopausal state: Secondary | ICD-10-CM | POA: Diagnosis not present

## 2021-10-05 DIAGNOSIS — M810 Age-related osteoporosis without current pathological fracture: Secondary | ICD-10-CM | POA: Diagnosis not present

## 2021-10-06 DIAGNOSIS — K529 Noninfective gastroenteritis and colitis, unspecified: Secondary | ICD-10-CM | POA: Diagnosis not present

## 2021-10-06 DIAGNOSIS — M6281 Muscle weakness (generalized): Secondary | ICD-10-CM | POA: Diagnosis not present

## 2021-10-06 DIAGNOSIS — I2510 Atherosclerotic heart disease of native coronary artery without angina pectoris: Secondary | ICD-10-CM | POA: Diagnosis not present

## 2021-10-06 DIAGNOSIS — U071 COVID-19: Secondary | ICD-10-CM | POA: Diagnosis not present

## 2021-10-06 DIAGNOSIS — R339 Retention of urine, unspecified: Secondary | ICD-10-CM | POA: Diagnosis not present

## 2021-10-06 DIAGNOSIS — E871 Hypo-osmolality and hyponatremia: Secondary | ICD-10-CM | POA: Diagnosis not present

## 2021-10-06 DIAGNOSIS — E7800 Pure hypercholesterolemia, unspecified: Secondary | ICD-10-CM | POA: Diagnosis not present

## 2021-10-06 DIAGNOSIS — H02834 Dermatochalasis of left upper eyelid: Secondary | ICD-10-CM | POA: Diagnosis not present

## 2021-10-06 DIAGNOSIS — H353132 Nonexudative age-related macular degeneration, bilateral, intermediate dry stage: Secondary | ICD-10-CM | POA: Diagnosis not present

## 2021-10-06 DIAGNOSIS — N390 Urinary tract infection, site not specified: Secondary | ICD-10-CM | POA: Diagnosis not present

## 2021-10-06 DIAGNOSIS — H6123 Impacted cerumen, bilateral: Secondary | ICD-10-CM | POA: Diagnosis not present

## 2021-10-06 DIAGNOSIS — Z792 Long term (current) use of antibiotics: Secondary | ICD-10-CM | POA: Diagnosis not present

## 2021-10-06 DIAGNOSIS — H43813 Vitreous degeneration, bilateral: Secondary | ICD-10-CM | POA: Diagnosis not present

## 2021-10-06 DIAGNOSIS — M62830 Muscle spasm of back: Secondary | ICD-10-CM | POA: Diagnosis not present

## 2021-10-06 DIAGNOSIS — E559 Vitamin D deficiency, unspecified: Secondary | ICD-10-CM | POA: Diagnosis not present

## 2021-10-06 DIAGNOSIS — I10 Essential (primary) hypertension: Secondary | ICD-10-CM | POA: Diagnosis not present

## 2021-10-06 DIAGNOSIS — M791 Myalgia, unspecified site: Secondary | ICD-10-CM | POA: Diagnosis not present

## 2021-10-06 DIAGNOSIS — G629 Polyneuropathy, unspecified: Secondary | ICD-10-CM | POA: Diagnosis not present

## 2021-10-06 DIAGNOSIS — Z86010 Personal history of colonic polyps: Secondary | ICD-10-CM | POA: Diagnosis not present

## 2021-10-06 DIAGNOSIS — M810 Age-related osteoporosis without current pathological fracture: Secondary | ICD-10-CM | POA: Diagnosis not present

## 2021-10-06 DIAGNOSIS — M1990 Unspecified osteoarthritis, unspecified site: Secondary | ICD-10-CM | POA: Diagnosis not present

## 2021-10-06 DIAGNOSIS — F4323 Adjustment disorder with mixed anxiety and depressed mood: Secondary | ICD-10-CM | POA: Diagnosis not present

## 2021-10-06 DIAGNOSIS — H02831 Dermatochalasis of right upper eyelid: Secondary | ICD-10-CM | POA: Diagnosis not present

## 2021-10-07 DIAGNOSIS — H02831 Dermatochalasis of right upper eyelid: Secondary | ICD-10-CM | POA: Diagnosis not present

## 2021-10-07 DIAGNOSIS — M1990 Unspecified osteoarthritis, unspecified site: Secondary | ICD-10-CM | POA: Diagnosis not present

## 2021-10-07 DIAGNOSIS — Z86010 Personal history of colonic polyps: Secondary | ICD-10-CM | POA: Diagnosis not present

## 2021-10-07 DIAGNOSIS — H43813 Vitreous degeneration, bilateral: Secondary | ICD-10-CM | POA: Diagnosis not present

## 2021-10-07 DIAGNOSIS — Z792 Long term (current) use of antibiotics: Secondary | ICD-10-CM | POA: Diagnosis not present

## 2021-10-07 DIAGNOSIS — G629 Polyneuropathy, unspecified: Secondary | ICD-10-CM | POA: Diagnosis not present

## 2021-10-07 DIAGNOSIS — N390 Urinary tract infection, site not specified: Secondary | ICD-10-CM | POA: Diagnosis not present

## 2021-10-07 DIAGNOSIS — E871 Hypo-osmolality and hyponatremia: Secondary | ICD-10-CM | POA: Diagnosis not present

## 2021-10-07 DIAGNOSIS — M6281 Muscle weakness (generalized): Secondary | ICD-10-CM | POA: Diagnosis not present

## 2021-10-07 DIAGNOSIS — F4323 Adjustment disorder with mixed anxiety and depressed mood: Secondary | ICD-10-CM | POA: Diagnosis not present

## 2021-10-07 DIAGNOSIS — I2510 Atherosclerotic heart disease of native coronary artery without angina pectoris: Secondary | ICD-10-CM | POA: Diagnosis not present

## 2021-10-07 DIAGNOSIS — H02834 Dermatochalasis of left upper eyelid: Secondary | ICD-10-CM | POA: Diagnosis not present

## 2021-10-07 DIAGNOSIS — E7800 Pure hypercholesterolemia, unspecified: Secondary | ICD-10-CM | POA: Diagnosis not present

## 2021-10-07 DIAGNOSIS — R339 Retention of urine, unspecified: Secondary | ICD-10-CM | POA: Diagnosis not present

## 2021-10-07 DIAGNOSIS — H353132 Nonexudative age-related macular degeneration, bilateral, intermediate dry stage: Secondary | ICD-10-CM | POA: Diagnosis not present

## 2021-10-07 DIAGNOSIS — U071 COVID-19: Secondary | ICD-10-CM | POA: Diagnosis not present

## 2021-10-07 DIAGNOSIS — M791 Myalgia, unspecified site: Secondary | ICD-10-CM | POA: Diagnosis not present

## 2021-10-07 DIAGNOSIS — K529 Noninfective gastroenteritis and colitis, unspecified: Secondary | ICD-10-CM | POA: Diagnosis not present

## 2021-10-07 DIAGNOSIS — M62830 Muscle spasm of back: Secondary | ICD-10-CM | POA: Diagnosis not present

## 2021-10-07 DIAGNOSIS — M810 Age-related osteoporosis without current pathological fracture: Secondary | ICD-10-CM | POA: Diagnosis not present

## 2021-10-07 DIAGNOSIS — E559 Vitamin D deficiency, unspecified: Secondary | ICD-10-CM | POA: Diagnosis not present

## 2021-10-07 DIAGNOSIS — H6123 Impacted cerumen, bilateral: Secondary | ICD-10-CM | POA: Diagnosis not present

## 2021-10-07 DIAGNOSIS — I10 Essential (primary) hypertension: Secondary | ICD-10-CM | POA: Diagnosis not present

## 2021-10-09 DIAGNOSIS — H43813 Vitreous degeneration, bilateral: Secondary | ICD-10-CM | POA: Diagnosis not present

## 2021-10-09 DIAGNOSIS — H02831 Dermatochalasis of right upper eyelid: Secondary | ICD-10-CM | POA: Diagnosis not present

## 2021-10-09 DIAGNOSIS — N390 Urinary tract infection, site not specified: Secondary | ICD-10-CM | POA: Diagnosis not present

## 2021-10-09 DIAGNOSIS — K529 Noninfective gastroenteritis and colitis, unspecified: Secondary | ICD-10-CM | POA: Diagnosis not present

## 2021-10-09 DIAGNOSIS — Z792 Long term (current) use of antibiotics: Secondary | ICD-10-CM | POA: Diagnosis not present

## 2021-10-09 DIAGNOSIS — I10 Essential (primary) hypertension: Secondary | ICD-10-CM | POA: Diagnosis not present

## 2021-10-09 DIAGNOSIS — I2510 Atherosclerotic heart disease of native coronary artery without angina pectoris: Secondary | ICD-10-CM | POA: Diagnosis not present

## 2021-10-09 DIAGNOSIS — Z86010 Personal history of colonic polyps: Secondary | ICD-10-CM | POA: Diagnosis not present

## 2021-10-09 DIAGNOSIS — M1990 Unspecified osteoarthritis, unspecified site: Secondary | ICD-10-CM | POA: Diagnosis not present

## 2021-10-09 DIAGNOSIS — H6123 Impacted cerumen, bilateral: Secondary | ICD-10-CM | POA: Diagnosis not present

## 2021-10-09 DIAGNOSIS — M62830 Muscle spasm of back: Secondary | ICD-10-CM | POA: Diagnosis not present

## 2021-10-09 DIAGNOSIS — U071 COVID-19: Secondary | ICD-10-CM | POA: Diagnosis not present

## 2021-10-09 DIAGNOSIS — H353132 Nonexudative age-related macular degeneration, bilateral, intermediate dry stage: Secondary | ICD-10-CM | POA: Diagnosis not present

## 2021-10-09 DIAGNOSIS — M791 Myalgia, unspecified site: Secondary | ICD-10-CM | POA: Diagnosis not present

## 2021-10-09 DIAGNOSIS — M6281 Muscle weakness (generalized): Secondary | ICD-10-CM | POA: Diagnosis not present

## 2021-10-09 DIAGNOSIS — G629 Polyneuropathy, unspecified: Secondary | ICD-10-CM | POA: Diagnosis not present

## 2021-10-09 DIAGNOSIS — E871 Hypo-osmolality and hyponatremia: Secondary | ICD-10-CM | POA: Diagnosis not present

## 2021-10-09 DIAGNOSIS — M810 Age-related osteoporosis without current pathological fracture: Secondary | ICD-10-CM | POA: Diagnosis not present

## 2021-10-09 DIAGNOSIS — E559 Vitamin D deficiency, unspecified: Secondary | ICD-10-CM | POA: Diagnosis not present

## 2021-10-09 DIAGNOSIS — E7800 Pure hypercholesterolemia, unspecified: Secondary | ICD-10-CM | POA: Diagnosis not present

## 2021-10-09 DIAGNOSIS — H02834 Dermatochalasis of left upper eyelid: Secondary | ICD-10-CM | POA: Diagnosis not present

## 2021-10-09 DIAGNOSIS — R339 Retention of urine, unspecified: Secondary | ICD-10-CM | POA: Diagnosis not present

## 2021-10-09 DIAGNOSIS — F4323 Adjustment disorder with mixed anxiety and depressed mood: Secondary | ICD-10-CM | POA: Diagnosis not present

## 2021-10-13 DIAGNOSIS — M62830 Muscle spasm of back: Secondary | ICD-10-CM | POA: Diagnosis not present

## 2021-10-13 DIAGNOSIS — G629 Polyneuropathy, unspecified: Secondary | ICD-10-CM | POA: Diagnosis not present

## 2021-10-13 DIAGNOSIS — H43813 Vitreous degeneration, bilateral: Secondary | ICD-10-CM | POA: Diagnosis not present

## 2021-10-13 DIAGNOSIS — I10 Essential (primary) hypertension: Secondary | ICD-10-CM | POA: Diagnosis not present

## 2021-10-13 DIAGNOSIS — M791 Myalgia, unspecified site: Secondary | ICD-10-CM | POA: Diagnosis not present

## 2021-10-13 DIAGNOSIS — M6281 Muscle weakness (generalized): Secondary | ICD-10-CM | POA: Diagnosis not present

## 2021-10-13 DIAGNOSIS — N390 Urinary tract infection, site not specified: Secondary | ICD-10-CM | POA: Diagnosis not present

## 2021-10-13 DIAGNOSIS — H02831 Dermatochalasis of right upper eyelid: Secondary | ICD-10-CM | POA: Diagnosis not present

## 2021-10-13 DIAGNOSIS — H02834 Dermatochalasis of left upper eyelid: Secondary | ICD-10-CM | POA: Diagnosis not present

## 2021-10-13 DIAGNOSIS — M1990 Unspecified osteoarthritis, unspecified site: Secondary | ICD-10-CM | POA: Diagnosis not present

## 2021-10-13 DIAGNOSIS — F4323 Adjustment disorder with mixed anxiety and depressed mood: Secondary | ICD-10-CM | POA: Diagnosis not present

## 2021-10-13 DIAGNOSIS — Z792 Long term (current) use of antibiotics: Secondary | ICD-10-CM | POA: Diagnosis not present

## 2021-10-13 DIAGNOSIS — M810 Age-related osteoporosis without current pathological fracture: Secondary | ICD-10-CM | POA: Diagnosis not present

## 2021-10-13 DIAGNOSIS — R339 Retention of urine, unspecified: Secondary | ICD-10-CM | POA: Diagnosis not present

## 2021-10-13 DIAGNOSIS — H6123 Impacted cerumen, bilateral: Secondary | ICD-10-CM | POA: Diagnosis not present

## 2021-10-13 DIAGNOSIS — Z86010 Personal history of colonic polyps: Secondary | ICD-10-CM | POA: Diagnosis not present

## 2021-10-13 DIAGNOSIS — H353132 Nonexudative age-related macular degeneration, bilateral, intermediate dry stage: Secondary | ICD-10-CM | POA: Diagnosis not present

## 2021-10-13 DIAGNOSIS — E559 Vitamin D deficiency, unspecified: Secondary | ICD-10-CM | POA: Diagnosis not present

## 2021-10-13 DIAGNOSIS — E7800 Pure hypercholesterolemia, unspecified: Secondary | ICD-10-CM | POA: Diagnosis not present

## 2021-10-13 DIAGNOSIS — U071 COVID-19: Secondary | ICD-10-CM | POA: Diagnosis not present

## 2021-10-13 DIAGNOSIS — E871 Hypo-osmolality and hyponatremia: Secondary | ICD-10-CM | POA: Diagnosis not present

## 2021-10-13 DIAGNOSIS — K529 Noninfective gastroenteritis and colitis, unspecified: Secondary | ICD-10-CM | POA: Diagnosis not present

## 2021-10-13 DIAGNOSIS — I2510 Atherosclerotic heart disease of native coronary artery without angina pectoris: Secondary | ICD-10-CM | POA: Diagnosis not present

## 2021-10-14 DIAGNOSIS — R42 Dizziness and giddiness: Secondary | ICD-10-CM | POA: Diagnosis not present

## 2021-10-14 DIAGNOSIS — R7989 Other specified abnormal findings of blood chemistry: Secondary | ICD-10-CM | POA: Diagnosis not present

## 2021-10-14 DIAGNOSIS — R194 Change in bowel habit: Secondary | ICD-10-CM | POA: Diagnosis not present

## 2021-10-14 DIAGNOSIS — E871 Hypo-osmolality and hyponatremia: Secondary | ICD-10-CM | POA: Diagnosis not present

## 2021-10-14 DIAGNOSIS — R350 Frequency of micturition: Secondary | ICD-10-CM | POA: Diagnosis not present

## 2021-10-15 DIAGNOSIS — M810 Age-related osteoporosis without current pathological fracture: Secondary | ICD-10-CM | POA: Diagnosis not present

## 2021-10-15 DIAGNOSIS — E871 Hypo-osmolality and hyponatremia: Secondary | ICD-10-CM | POA: Diagnosis not present

## 2021-10-15 DIAGNOSIS — I10 Essential (primary) hypertension: Secondary | ICD-10-CM | POA: Diagnosis not present

## 2021-10-15 DIAGNOSIS — G629 Polyneuropathy, unspecified: Secondary | ICD-10-CM | POA: Diagnosis not present

## 2021-10-15 DIAGNOSIS — E559 Vitamin D deficiency, unspecified: Secondary | ICD-10-CM | POA: Diagnosis not present

## 2021-10-15 DIAGNOSIS — M62830 Muscle spasm of back: Secondary | ICD-10-CM | POA: Diagnosis not present

## 2021-10-15 DIAGNOSIS — M1990 Unspecified osteoarthritis, unspecified site: Secondary | ICD-10-CM | POA: Diagnosis not present

## 2021-10-15 DIAGNOSIS — H02831 Dermatochalasis of right upper eyelid: Secondary | ICD-10-CM | POA: Diagnosis not present

## 2021-10-15 DIAGNOSIS — E7800 Pure hypercholesterolemia, unspecified: Secondary | ICD-10-CM | POA: Diagnosis not present

## 2021-10-15 DIAGNOSIS — M791 Myalgia, unspecified site: Secondary | ICD-10-CM | POA: Diagnosis not present

## 2021-10-15 DIAGNOSIS — R339 Retention of urine, unspecified: Secondary | ICD-10-CM | POA: Diagnosis not present

## 2021-10-15 DIAGNOSIS — Z86010 Personal history of colonic polyps: Secondary | ICD-10-CM | POA: Diagnosis not present

## 2021-10-15 DIAGNOSIS — U071 COVID-19: Secondary | ICD-10-CM | POA: Diagnosis not present

## 2021-10-15 DIAGNOSIS — H353132 Nonexudative age-related macular degeneration, bilateral, intermediate dry stage: Secondary | ICD-10-CM | POA: Diagnosis not present

## 2021-10-15 DIAGNOSIS — M6281 Muscle weakness (generalized): Secondary | ICD-10-CM | POA: Diagnosis not present

## 2021-10-15 DIAGNOSIS — Z792 Long term (current) use of antibiotics: Secondary | ICD-10-CM | POA: Diagnosis not present

## 2021-10-15 DIAGNOSIS — N390 Urinary tract infection, site not specified: Secondary | ICD-10-CM | POA: Diagnosis not present

## 2021-10-15 DIAGNOSIS — H43813 Vitreous degeneration, bilateral: Secondary | ICD-10-CM | POA: Diagnosis not present

## 2021-10-15 DIAGNOSIS — H02834 Dermatochalasis of left upper eyelid: Secondary | ICD-10-CM | POA: Diagnosis not present

## 2021-10-15 DIAGNOSIS — K529 Noninfective gastroenteritis and colitis, unspecified: Secondary | ICD-10-CM | POA: Diagnosis not present

## 2021-10-15 DIAGNOSIS — F4323 Adjustment disorder with mixed anxiety and depressed mood: Secondary | ICD-10-CM | POA: Diagnosis not present

## 2021-10-15 DIAGNOSIS — I2510 Atherosclerotic heart disease of native coronary artery without angina pectoris: Secondary | ICD-10-CM | POA: Diagnosis not present

## 2021-10-15 DIAGNOSIS — H6123 Impacted cerumen, bilateral: Secondary | ICD-10-CM | POA: Diagnosis not present

## 2021-10-19 DIAGNOSIS — M791 Myalgia, unspecified site: Secondary | ICD-10-CM | POA: Diagnosis not present

## 2021-10-19 DIAGNOSIS — I2510 Atherosclerotic heart disease of native coronary artery without angina pectoris: Secondary | ICD-10-CM | POA: Diagnosis not present

## 2021-10-19 DIAGNOSIS — F4323 Adjustment disorder with mixed anxiety and depressed mood: Secondary | ICD-10-CM | POA: Diagnosis not present

## 2021-10-19 DIAGNOSIS — H353132 Nonexudative age-related macular degeneration, bilateral, intermediate dry stage: Secondary | ICD-10-CM | POA: Diagnosis not present

## 2021-10-19 DIAGNOSIS — I10 Essential (primary) hypertension: Secondary | ICD-10-CM | POA: Diagnosis not present

## 2021-10-19 DIAGNOSIS — M810 Age-related osteoporosis without current pathological fracture: Secondary | ICD-10-CM | POA: Diagnosis not present

## 2021-10-19 DIAGNOSIS — H02834 Dermatochalasis of left upper eyelid: Secondary | ICD-10-CM | POA: Diagnosis not present

## 2021-10-19 DIAGNOSIS — N390 Urinary tract infection, site not specified: Secondary | ICD-10-CM | POA: Diagnosis not present

## 2021-10-19 DIAGNOSIS — E871 Hypo-osmolality and hyponatremia: Secondary | ICD-10-CM | POA: Diagnosis not present

## 2021-10-19 DIAGNOSIS — H02831 Dermatochalasis of right upper eyelid: Secondary | ICD-10-CM | POA: Diagnosis not present

## 2021-10-19 DIAGNOSIS — K529 Noninfective gastroenteritis and colitis, unspecified: Secondary | ICD-10-CM | POA: Diagnosis not present

## 2021-10-19 DIAGNOSIS — H6123 Impacted cerumen, bilateral: Secondary | ICD-10-CM | POA: Diagnosis not present

## 2021-10-19 DIAGNOSIS — M1990 Unspecified osteoarthritis, unspecified site: Secondary | ICD-10-CM | POA: Diagnosis not present

## 2021-10-19 DIAGNOSIS — M6281 Muscle weakness (generalized): Secondary | ICD-10-CM | POA: Diagnosis not present

## 2021-10-19 DIAGNOSIS — E7800 Pure hypercholesterolemia, unspecified: Secondary | ICD-10-CM | POA: Diagnosis not present

## 2021-10-19 DIAGNOSIS — E559 Vitamin D deficiency, unspecified: Secondary | ICD-10-CM | POA: Diagnosis not present

## 2021-10-19 DIAGNOSIS — G629 Polyneuropathy, unspecified: Secondary | ICD-10-CM | POA: Diagnosis not present

## 2021-10-19 DIAGNOSIS — U071 COVID-19: Secondary | ICD-10-CM | POA: Diagnosis not present

## 2021-10-19 DIAGNOSIS — Z86010 Personal history of colonic polyps: Secondary | ICD-10-CM | POA: Diagnosis not present

## 2021-10-19 DIAGNOSIS — M62830 Muscle spasm of back: Secondary | ICD-10-CM | POA: Diagnosis not present

## 2021-10-19 DIAGNOSIS — R339 Retention of urine, unspecified: Secondary | ICD-10-CM | POA: Diagnosis not present

## 2021-10-19 DIAGNOSIS — H43813 Vitreous degeneration, bilateral: Secondary | ICD-10-CM | POA: Diagnosis not present

## 2021-10-19 DIAGNOSIS — Z792 Long term (current) use of antibiotics: Secondary | ICD-10-CM | POA: Diagnosis not present

## 2021-10-21 DIAGNOSIS — H02831 Dermatochalasis of right upper eyelid: Secondary | ICD-10-CM | POA: Diagnosis not present

## 2021-10-21 DIAGNOSIS — E871 Hypo-osmolality and hyponatremia: Secondary | ICD-10-CM | POA: Diagnosis not present

## 2021-10-21 DIAGNOSIS — I2510 Atherosclerotic heart disease of native coronary artery without angina pectoris: Secondary | ICD-10-CM | POA: Diagnosis not present

## 2021-10-21 DIAGNOSIS — R339 Retention of urine, unspecified: Secondary | ICD-10-CM | POA: Diagnosis not present

## 2021-10-21 DIAGNOSIS — N390 Urinary tract infection, site not specified: Secondary | ICD-10-CM | POA: Diagnosis not present

## 2021-10-21 DIAGNOSIS — M6281 Muscle weakness (generalized): Secondary | ICD-10-CM | POA: Diagnosis not present

## 2021-10-21 DIAGNOSIS — M62830 Muscle spasm of back: Secondary | ICD-10-CM | POA: Diagnosis not present

## 2021-10-21 DIAGNOSIS — H43813 Vitreous degeneration, bilateral: Secondary | ICD-10-CM | POA: Diagnosis not present

## 2021-10-21 DIAGNOSIS — M791 Myalgia, unspecified site: Secondary | ICD-10-CM | POA: Diagnosis not present

## 2021-10-21 DIAGNOSIS — G629 Polyneuropathy, unspecified: Secondary | ICD-10-CM | POA: Diagnosis not present

## 2021-10-21 DIAGNOSIS — U071 COVID-19: Secondary | ICD-10-CM | POA: Diagnosis not present

## 2021-10-21 DIAGNOSIS — Z86010 Personal history of colonic polyps: Secondary | ICD-10-CM | POA: Diagnosis not present

## 2021-10-21 DIAGNOSIS — F4323 Adjustment disorder with mixed anxiety and depressed mood: Secondary | ICD-10-CM | POA: Diagnosis not present

## 2021-10-21 DIAGNOSIS — E7800 Pure hypercholesterolemia, unspecified: Secondary | ICD-10-CM | POA: Diagnosis not present

## 2021-10-21 DIAGNOSIS — M810 Age-related osteoporosis without current pathological fracture: Secondary | ICD-10-CM | POA: Diagnosis not present

## 2021-10-21 DIAGNOSIS — H353132 Nonexudative age-related macular degeneration, bilateral, intermediate dry stage: Secondary | ICD-10-CM | POA: Diagnosis not present

## 2021-10-21 DIAGNOSIS — M1990 Unspecified osteoarthritis, unspecified site: Secondary | ICD-10-CM | POA: Diagnosis not present

## 2021-10-21 DIAGNOSIS — E559 Vitamin D deficiency, unspecified: Secondary | ICD-10-CM | POA: Diagnosis not present

## 2021-10-21 DIAGNOSIS — H6123 Impacted cerumen, bilateral: Secondary | ICD-10-CM | POA: Diagnosis not present

## 2021-10-21 DIAGNOSIS — H02834 Dermatochalasis of left upper eyelid: Secondary | ICD-10-CM | POA: Diagnosis not present

## 2021-10-21 DIAGNOSIS — Z792 Long term (current) use of antibiotics: Secondary | ICD-10-CM | POA: Diagnosis not present

## 2021-10-21 DIAGNOSIS — I10 Essential (primary) hypertension: Secondary | ICD-10-CM | POA: Diagnosis not present

## 2021-10-21 DIAGNOSIS — K529 Noninfective gastroenteritis and colitis, unspecified: Secondary | ICD-10-CM | POA: Diagnosis not present

## 2021-10-27 DIAGNOSIS — M1990 Unspecified osteoarthritis, unspecified site: Secondary | ICD-10-CM | POA: Diagnosis not present

## 2021-10-27 DIAGNOSIS — Z792 Long term (current) use of antibiotics: Secondary | ICD-10-CM | POA: Diagnosis not present

## 2021-10-27 DIAGNOSIS — U071 COVID-19: Secondary | ICD-10-CM | POA: Diagnosis not present

## 2021-10-27 DIAGNOSIS — F4323 Adjustment disorder with mixed anxiety and depressed mood: Secondary | ICD-10-CM | POA: Diagnosis not present

## 2021-10-27 DIAGNOSIS — H6123 Impacted cerumen, bilateral: Secondary | ICD-10-CM | POA: Diagnosis not present

## 2021-10-27 DIAGNOSIS — H43813 Vitreous degeneration, bilateral: Secondary | ICD-10-CM | POA: Diagnosis not present

## 2021-10-27 DIAGNOSIS — N390 Urinary tract infection, site not specified: Secondary | ICD-10-CM | POA: Diagnosis not present

## 2021-10-27 DIAGNOSIS — H02831 Dermatochalasis of right upper eyelid: Secondary | ICD-10-CM | POA: Diagnosis not present

## 2021-10-27 DIAGNOSIS — M6281 Muscle weakness (generalized): Secondary | ICD-10-CM | POA: Diagnosis not present

## 2021-10-27 DIAGNOSIS — H353132 Nonexudative age-related macular degeneration, bilateral, intermediate dry stage: Secondary | ICD-10-CM | POA: Diagnosis not present

## 2021-10-27 DIAGNOSIS — H02834 Dermatochalasis of left upper eyelid: Secondary | ICD-10-CM | POA: Diagnosis not present

## 2021-10-27 DIAGNOSIS — E871 Hypo-osmolality and hyponatremia: Secondary | ICD-10-CM | POA: Diagnosis not present

## 2021-10-27 DIAGNOSIS — Z86010 Personal history of colonic polyps: Secondary | ICD-10-CM | POA: Diagnosis not present

## 2021-10-27 DIAGNOSIS — I10 Essential (primary) hypertension: Secondary | ICD-10-CM | POA: Diagnosis not present

## 2021-10-27 DIAGNOSIS — R339 Retention of urine, unspecified: Secondary | ICD-10-CM | POA: Diagnosis not present

## 2021-10-27 DIAGNOSIS — M791 Myalgia, unspecified site: Secondary | ICD-10-CM | POA: Diagnosis not present

## 2021-10-27 DIAGNOSIS — K529 Noninfective gastroenteritis and colitis, unspecified: Secondary | ICD-10-CM | POA: Diagnosis not present

## 2021-10-27 DIAGNOSIS — E7800 Pure hypercholesterolemia, unspecified: Secondary | ICD-10-CM | POA: Diagnosis not present

## 2021-10-27 DIAGNOSIS — E559 Vitamin D deficiency, unspecified: Secondary | ICD-10-CM | POA: Diagnosis not present

## 2021-10-27 DIAGNOSIS — M810 Age-related osteoporosis without current pathological fracture: Secondary | ICD-10-CM | POA: Diagnosis not present

## 2021-10-27 DIAGNOSIS — I2510 Atherosclerotic heart disease of native coronary artery without angina pectoris: Secondary | ICD-10-CM | POA: Diagnosis not present

## 2021-10-27 DIAGNOSIS — G629 Polyneuropathy, unspecified: Secondary | ICD-10-CM | POA: Diagnosis not present

## 2021-10-27 DIAGNOSIS — M62830 Muscle spasm of back: Secondary | ICD-10-CM | POA: Diagnosis not present

## 2021-10-28 DIAGNOSIS — H02831 Dermatochalasis of right upper eyelid: Secondary | ICD-10-CM | POA: Diagnosis not present

## 2021-10-28 DIAGNOSIS — H6123 Impacted cerumen, bilateral: Secondary | ICD-10-CM | POA: Diagnosis not present

## 2021-10-28 DIAGNOSIS — M791 Myalgia, unspecified site: Secondary | ICD-10-CM | POA: Diagnosis not present

## 2021-10-28 DIAGNOSIS — I10 Essential (primary) hypertension: Secondary | ICD-10-CM | POA: Diagnosis not present

## 2021-10-28 DIAGNOSIS — M62830 Muscle spasm of back: Secondary | ICD-10-CM | POA: Diagnosis not present

## 2021-10-28 DIAGNOSIS — N390 Urinary tract infection, site not specified: Secondary | ICD-10-CM | POA: Diagnosis not present

## 2021-10-28 DIAGNOSIS — Z86010 Personal history of colonic polyps: Secondary | ICD-10-CM | POA: Diagnosis not present

## 2021-10-28 DIAGNOSIS — E559 Vitamin D deficiency, unspecified: Secondary | ICD-10-CM | POA: Diagnosis not present

## 2021-10-28 DIAGNOSIS — Z792 Long term (current) use of antibiotics: Secondary | ICD-10-CM | POA: Diagnosis not present

## 2021-10-28 DIAGNOSIS — G629 Polyneuropathy, unspecified: Secondary | ICD-10-CM | POA: Diagnosis not present

## 2021-10-28 DIAGNOSIS — E871 Hypo-osmolality and hyponatremia: Secondary | ICD-10-CM | POA: Diagnosis not present

## 2021-10-28 DIAGNOSIS — H02834 Dermatochalasis of left upper eyelid: Secondary | ICD-10-CM | POA: Diagnosis not present

## 2021-10-28 DIAGNOSIS — I2510 Atherosclerotic heart disease of native coronary artery without angina pectoris: Secondary | ICD-10-CM | POA: Diagnosis not present

## 2021-10-28 DIAGNOSIS — F4323 Adjustment disorder with mixed anxiety and depressed mood: Secondary | ICD-10-CM | POA: Diagnosis not present

## 2021-10-28 DIAGNOSIS — K529 Noninfective gastroenteritis and colitis, unspecified: Secondary | ICD-10-CM | POA: Diagnosis not present

## 2021-10-28 DIAGNOSIS — M810 Age-related osteoporosis without current pathological fracture: Secondary | ICD-10-CM | POA: Diagnosis not present

## 2021-10-28 DIAGNOSIS — H43813 Vitreous degeneration, bilateral: Secondary | ICD-10-CM | POA: Diagnosis not present

## 2021-10-28 DIAGNOSIS — U071 COVID-19: Secondary | ICD-10-CM | POA: Diagnosis not present

## 2021-10-28 DIAGNOSIS — H353132 Nonexudative age-related macular degeneration, bilateral, intermediate dry stage: Secondary | ICD-10-CM | POA: Diagnosis not present

## 2021-10-28 DIAGNOSIS — M6281 Muscle weakness (generalized): Secondary | ICD-10-CM | POA: Diagnosis not present

## 2021-10-28 DIAGNOSIS — E7800 Pure hypercholesterolemia, unspecified: Secondary | ICD-10-CM | POA: Diagnosis not present

## 2021-10-28 DIAGNOSIS — R339 Retention of urine, unspecified: Secondary | ICD-10-CM | POA: Diagnosis not present

## 2021-10-28 DIAGNOSIS — M1990 Unspecified osteoarthritis, unspecified site: Secondary | ICD-10-CM | POA: Diagnosis not present

## 2021-11-03 DIAGNOSIS — E7800 Pure hypercholesterolemia, unspecified: Secondary | ICD-10-CM | POA: Diagnosis not present

## 2021-11-03 DIAGNOSIS — R339 Retention of urine, unspecified: Secondary | ICD-10-CM | POA: Diagnosis not present

## 2021-11-03 DIAGNOSIS — H353132 Nonexudative age-related macular degeneration, bilateral, intermediate dry stage: Secondary | ICD-10-CM | POA: Diagnosis not present

## 2021-11-03 DIAGNOSIS — M810 Age-related osteoporosis without current pathological fracture: Secondary | ICD-10-CM | POA: Diagnosis not present

## 2021-11-03 DIAGNOSIS — G629 Polyneuropathy, unspecified: Secondary | ICD-10-CM | POA: Diagnosis not present

## 2021-11-03 DIAGNOSIS — H02831 Dermatochalasis of right upper eyelid: Secondary | ICD-10-CM | POA: Diagnosis not present

## 2021-11-03 DIAGNOSIS — E559 Vitamin D deficiency, unspecified: Secondary | ICD-10-CM | POA: Diagnosis not present

## 2021-11-03 DIAGNOSIS — E871 Hypo-osmolality and hyponatremia: Secondary | ICD-10-CM | POA: Diagnosis not present

## 2021-11-03 DIAGNOSIS — N390 Urinary tract infection, site not specified: Secondary | ICD-10-CM | POA: Diagnosis not present

## 2021-11-03 DIAGNOSIS — M62830 Muscle spasm of back: Secondary | ICD-10-CM | POA: Diagnosis not present

## 2021-11-03 DIAGNOSIS — M1990 Unspecified osteoarthritis, unspecified site: Secondary | ICD-10-CM | POA: Diagnosis not present

## 2021-11-03 DIAGNOSIS — H02834 Dermatochalasis of left upper eyelid: Secondary | ICD-10-CM | POA: Diagnosis not present

## 2021-11-03 DIAGNOSIS — M791 Myalgia, unspecified site: Secondary | ICD-10-CM | POA: Diagnosis not present

## 2021-11-03 DIAGNOSIS — H6123 Impacted cerumen, bilateral: Secondary | ICD-10-CM | POA: Diagnosis not present

## 2021-11-03 DIAGNOSIS — M6281 Muscle weakness (generalized): Secondary | ICD-10-CM | POA: Diagnosis not present

## 2021-11-03 DIAGNOSIS — H43813 Vitreous degeneration, bilateral: Secondary | ICD-10-CM | POA: Diagnosis not present

## 2021-11-03 DIAGNOSIS — U071 COVID-19: Secondary | ICD-10-CM | POA: Diagnosis not present

## 2021-11-03 DIAGNOSIS — Z86010 Personal history of colonic polyps: Secondary | ICD-10-CM | POA: Diagnosis not present

## 2021-11-03 DIAGNOSIS — Z792 Long term (current) use of antibiotics: Secondary | ICD-10-CM | POA: Diagnosis not present

## 2021-11-03 DIAGNOSIS — I10 Essential (primary) hypertension: Secondary | ICD-10-CM | POA: Diagnosis not present

## 2021-11-03 DIAGNOSIS — F4323 Adjustment disorder with mixed anxiety and depressed mood: Secondary | ICD-10-CM | POA: Diagnosis not present

## 2021-11-03 DIAGNOSIS — I2510 Atherosclerotic heart disease of native coronary artery without angina pectoris: Secondary | ICD-10-CM | POA: Diagnosis not present

## 2021-11-03 DIAGNOSIS — K529 Noninfective gastroenteritis and colitis, unspecified: Secondary | ICD-10-CM | POA: Diagnosis not present

## 2021-11-09 DIAGNOSIS — H353132 Nonexudative age-related macular degeneration, bilateral, intermediate dry stage: Secondary | ICD-10-CM | POA: Diagnosis not present

## 2021-11-09 DIAGNOSIS — E871 Hypo-osmolality and hyponatremia: Secondary | ICD-10-CM | POA: Diagnosis not present

## 2021-11-09 DIAGNOSIS — U071 COVID-19: Secondary | ICD-10-CM | POA: Diagnosis not present

## 2021-11-09 DIAGNOSIS — H02831 Dermatochalasis of right upper eyelid: Secondary | ICD-10-CM | POA: Diagnosis not present

## 2021-11-09 DIAGNOSIS — R339 Retention of urine, unspecified: Secondary | ICD-10-CM | POA: Diagnosis not present

## 2021-11-09 DIAGNOSIS — K529 Noninfective gastroenteritis and colitis, unspecified: Secondary | ICD-10-CM | POA: Diagnosis not present

## 2021-11-09 DIAGNOSIS — N390 Urinary tract infection, site not specified: Secondary | ICD-10-CM | POA: Diagnosis not present

## 2021-11-09 DIAGNOSIS — M791 Myalgia, unspecified site: Secondary | ICD-10-CM | POA: Diagnosis not present

## 2021-11-09 DIAGNOSIS — H02834 Dermatochalasis of left upper eyelid: Secondary | ICD-10-CM | POA: Diagnosis not present

## 2021-11-09 DIAGNOSIS — E7800 Pure hypercholesterolemia, unspecified: Secondary | ICD-10-CM | POA: Diagnosis not present

## 2021-11-09 DIAGNOSIS — M62830 Muscle spasm of back: Secondary | ICD-10-CM | POA: Diagnosis not present

## 2021-11-09 DIAGNOSIS — E559 Vitamin D deficiency, unspecified: Secondary | ICD-10-CM | POA: Diagnosis not present

## 2021-11-09 DIAGNOSIS — G629 Polyneuropathy, unspecified: Secondary | ICD-10-CM | POA: Diagnosis not present

## 2021-11-09 DIAGNOSIS — F4323 Adjustment disorder with mixed anxiety and depressed mood: Secondary | ICD-10-CM | POA: Diagnosis not present

## 2021-11-09 DIAGNOSIS — Z792 Long term (current) use of antibiotics: Secondary | ICD-10-CM | POA: Diagnosis not present

## 2021-11-09 DIAGNOSIS — Z86010 Personal history of colonic polyps: Secondary | ICD-10-CM | POA: Diagnosis not present

## 2021-11-09 DIAGNOSIS — M1990 Unspecified osteoarthritis, unspecified site: Secondary | ICD-10-CM | POA: Diagnosis not present

## 2021-11-09 DIAGNOSIS — M6281 Muscle weakness (generalized): Secondary | ICD-10-CM | POA: Diagnosis not present

## 2021-11-09 DIAGNOSIS — I2510 Atherosclerotic heart disease of native coronary artery without angina pectoris: Secondary | ICD-10-CM | POA: Diagnosis not present

## 2021-11-09 DIAGNOSIS — I10 Essential (primary) hypertension: Secondary | ICD-10-CM | POA: Diagnosis not present

## 2021-11-09 DIAGNOSIS — M810 Age-related osteoporosis without current pathological fracture: Secondary | ICD-10-CM | POA: Diagnosis not present

## 2021-11-09 DIAGNOSIS — H6123 Impacted cerumen, bilateral: Secondary | ICD-10-CM | POA: Diagnosis not present

## 2021-11-09 DIAGNOSIS — H43813 Vitreous degeneration, bilateral: Secondary | ICD-10-CM | POA: Diagnosis not present

## 2021-11-12 DIAGNOSIS — H353132 Nonexudative age-related macular degeneration, bilateral, intermediate dry stage: Secondary | ICD-10-CM | POA: Diagnosis not present

## 2021-11-12 DIAGNOSIS — R339 Retention of urine, unspecified: Secondary | ICD-10-CM | POA: Diagnosis not present

## 2021-11-12 DIAGNOSIS — M791 Myalgia, unspecified site: Secondary | ICD-10-CM | POA: Diagnosis not present

## 2021-11-12 DIAGNOSIS — M810 Age-related osteoporosis without current pathological fracture: Secondary | ICD-10-CM | POA: Diagnosis not present

## 2021-11-12 DIAGNOSIS — M6281 Muscle weakness (generalized): Secondary | ICD-10-CM | POA: Diagnosis not present

## 2021-11-12 DIAGNOSIS — M62830 Muscle spasm of back: Secondary | ICD-10-CM | POA: Diagnosis not present

## 2021-11-12 DIAGNOSIS — I2510 Atherosclerotic heart disease of native coronary artery without angina pectoris: Secondary | ICD-10-CM | POA: Diagnosis not present

## 2021-11-12 DIAGNOSIS — Z792 Long term (current) use of antibiotics: Secondary | ICD-10-CM | POA: Diagnosis not present

## 2021-11-12 DIAGNOSIS — H43813 Vitreous degeneration, bilateral: Secondary | ICD-10-CM | POA: Diagnosis not present

## 2021-11-12 DIAGNOSIS — K529 Noninfective gastroenteritis and colitis, unspecified: Secondary | ICD-10-CM | POA: Diagnosis not present

## 2021-11-12 DIAGNOSIS — E871 Hypo-osmolality and hyponatremia: Secondary | ICD-10-CM | POA: Diagnosis not present

## 2021-11-12 DIAGNOSIS — G629 Polyneuropathy, unspecified: Secondary | ICD-10-CM | POA: Diagnosis not present

## 2021-11-12 DIAGNOSIS — F4323 Adjustment disorder with mixed anxiety and depressed mood: Secondary | ICD-10-CM | POA: Diagnosis not present

## 2021-11-12 DIAGNOSIS — E559 Vitamin D deficiency, unspecified: Secondary | ICD-10-CM | POA: Diagnosis not present

## 2021-11-12 DIAGNOSIS — H6123 Impacted cerumen, bilateral: Secondary | ICD-10-CM | POA: Diagnosis not present

## 2021-11-12 DIAGNOSIS — U071 COVID-19: Secondary | ICD-10-CM | POA: Diagnosis not present

## 2021-11-12 DIAGNOSIS — N390 Urinary tract infection, site not specified: Secondary | ICD-10-CM | POA: Diagnosis not present

## 2021-11-12 DIAGNOSIS — E7800 Pure hypercholesterolemia, unspecified: Secondary | ICD-10-CM | POA: Diagnosis not present

## 2021-11-12 DIAGNOSIS — H02831 Dermatochalasis of right upper eyelid: Secondary | ICD-10-CM | POA: Diagnosis not present

## 2021-11-12 DIAGNOSIS — M1990 Unspecified osteoarthritis, unspecified site: Secondary | ICD-10-CM | POA: Diagnosis not present

## 2021-11-12 DIAGNOSIS — I10 Essential (primary) hypertension: Secondary | ICD-10-CM | POA: Diagnosis not present

## 2021-11-12 DIAGNOSIS — H02834 Dermatochalasis of left upper eyelid: Secondary | ICD-10-CM | POA: Diagnosis not present

## 2021-11-12 DIAGNOSIS — Z86010 Personal history of colonic polyps: Secondary | ICD-10-CM | POA: Diagnosis not present

## 2021-11-13 DIAGNOSIS — R42 Dizziness and giddiness: Secondary | ICD-10-CM | POA: Diagnosis not present

## 2021-11-13 DIAGNOSIS — R350 Frequency of micturition: Secondary | ICD-10-CM | POA: Diagnosis not present

## 2021-11-13 DIAGNOSIS — E860 Dehydration: Secondary | ICD-10-CM | POA: Diagnosis not present

## 2021-11-13 DIAGNOSIS — E871 Hypo-osmolality and hyponatremia: Secondary | ICD-10-CM | POA: Diagnosis not present

## 2021-11-13 DIAGNOSIS — M810 Age-related osteoporosis without current pathological fracture: Secondary | ICD-10-CM | POA: Diagnosis not present

## 2021-11-13 DIAGNOSIS — R7989 Other specified abnormal findings of blood chemistry: Secondary | ICD-10-CM | POA: Diagnosis not present

## 2021-11-17 DIAGNOSIS — R351 Nocturia: Secondary | ICD-10-CM | POA: Diagnosis not present

## 2021-11-19 DIAGNOSIS — H02834 Dermatochalasis of left upper eyelid: Secondary | ICD-10-CM | POA: Diagnosis not present

## 2021-11-19 DIAGNOSIS — M1990 Unspecified osteoarthritis, unspecified site: Secondary | ICD-10-CM | POA: Diagnosis not present

## 2021-11-19 DIAGNOSIS — H52203 Unspecified astigmatism, bilateral: Secondary | ICD-10-CM | POA: Diagnosis not present

## 2021-11-19 DIAGNOSIS — F4323 Adjustment disorder with mixed anxiety and depressed mood: Secondary | ICD-10-CM | POA: Diagnosis not present

## 2021-11-19 DIAGNOSIS — H524 Presbyopia: Secondary | ICD-10-CM | POA: Diagnosis not present

## 2021-11-19 DIAGNOSIS — I10 Essential (primary) hypertension: Secondary | ICD-10-CM | POA: Diagnosis not present

## 2021-11-19 DIAGNOSIS — H04123 Dry eye syndrome of bilateral lacrimal glands: Secondary | ICD-10-CM | POA: Diagnosis not present

## 2021-11-19 DIAGNOSIS — H35372 Puckering of macula, left eye: Secondary | ICD-10-CM | POA: Diagnosis not present

## 2021-11-19 DIAGNOSIS — Z961 Presence of intraocular lens: Secondary | ICD-10-CM | POA: Diagnosis not present

## 2021-11-19 DIAGNOSIS — M6281 Muscle weakness (generalized): Secondary | ICD-10-CM | POA: Diagnosis not present

## 2021-11-19 DIAGNOSIS — H43813 Vitreous degeneration, bilateral: Secondary | ICD-10-CM | POA: Diagnosis not present

## 2021-11-19 DIAGNOSIS — H353132 Nonexudative age-related macular degeneration, bilateral, intermediate dry stage: Secondary | ICD-10-CM | POA: Diagnosis not present

## 2021-11-19 DIAGNOSIS — H3554 Dystrophies primarily involving the retinal pigment epithelium: Secondary | ICD-10-CM | POA: Diagnosis not present

## 2021-11-19 DIAGNOSIS — Z792 Long term (current) use of antibiotics: Secondary | ICD-10-CM | POA: Diagnosis not present

## 2021-11-19 DIAGNOSIS — Z86010 Personal history of colonic polyps: Secondary | ICD-10-CM | POA: Diagnosis not present

## 2021-11-19 DIAGNOSIS — N390 Urinary tract infection, site not specified: Secondary | ICD-10-CM | POA: Diagnosis not present

## 2021-11-19 DIAGNOSIS — K529 Noninfective gastroenteritis and colitis, unspecified: Secondary | ICD-10-CM | POA: Diagnosis not present

## 2021-11-19 DIAGNOSIS — E871 Hypo-osmolality and hyponatremia: Secondary | ICD-10-CM | POA: Diagnosis not present

## 2021-11-19 DIAGNOSIS — E7800 Pure hypercholesterolemia, unspecified: Secondary | ICD-10-CM | POA: Diagnosis not present

## 2021-11-19 DIAGNOSIS — I2510 Atherosclerotic heart disease of native coronary artery without angina pectoris: Secondary | ICD-10-CM | POA: Diagnosis not present

## 2021-11-19 DIAGNOSIS — M791 Myalgia, unspecified site: Secondary | ICD-10-CM | POA: Diagnosis not present

## 2021-11-19 DIAGNOSIS — M62830 Muscle spasm of back: Secondary | ICD-10-CM | POA: Diagnosis not present

## 2021-11-19 DIAGNOSIS — M810 Age-related osteoporosis without current pathological fracture: Secondary | ICD-10-CM | POA: Diagnosis not present

## 2021-11-19 DIAGNOSIS — U071 COVID-19: Secondary | ICD-10-CM | POA: Diagnosis not present

## 2021-11-19 DIAGNOSIS — R339 Retention of urine, unspecified: Secondary | ICD-10-CM | POA: Diagnosis not present

## 2021-11-19 DIAGNOSIS — G629 Polyneuropathy, unspecified: Secondary | ICD-10-CM | POA: Diagnosis not present

## 2021-11-19 DIAGNOSIS — H6123 Impacted cerumen, bilateral: Secondary | ICD-10-CM | POA: Diagnosis not present

## 2021-11-19 DIAGNOSIS — H02831 Dermatochalasis of right upper eyelid: Secondary | ICD-10-CM | POA: Diagnosis not present

## 2021-11-19 DIAGNOSIS — H35363 Drusen (degenerative) of macula, bilateral: Secondary | ICD-10-CM | POA: Diagnosis not present

## 2021-11-19 DIAGNOSIS — H26491 Other secondary cataract, right eye: Secondary | ICD-10-CM | POA: Diagnosis not present

## 2021-11-19 DIAGNOSIS — H5202 Hypermetropia, left eye: Secondary | ICD-10-CM | POA: Diagnosis not present

## 2021-11-19 DIAGNOSIS — E559 Vitamin D deficiency, unspecified: Secondary | ICD-10-CM | POA: Diagnosis not present

## 2021-11-20 DIAGNOSIS — H6123 Impacted cerumen, bilateral: Secondary | ICD-10-CM | POA: Diagnosis not present

## 2021-11-20 DIAGNOSIS — H02831 Dermatochalasis of right upper eyelid: Secondary | ICD-10-CM | POA: Diagnosis not present

## 2021-11-20 DIAGNOSIS — G629 Polyneuropathy, unspecified: Secondary | ICD-10-CM | POA: Diagnosis not present

## 2021-11-20 DIAGNOSIS — N390 Urinary tract infection, site not specified: Secondary | ICD-10-CM | POA: Diagnosis not present

## 2021-11-20 DIAGNOSIS — K529 Noninfective gastroenteritis and colitis, unspecified: Secondary | ICD-10-CM | POA: Diagnosis not present

## 2021-11-20 DIAGNOSIS — M810 Age-related osteoporosis without current pathological fracture: Secondary | ICD-10-CM | POA: Diagnosis not present

## 2021-11-20 DIAGNOSIS — M6281 Muscle weakness (generalized): Secondary | ICD-10-CM | POA: Diagnosis not present

## 2021-11-20 DIAGNOSIS — Z86010 Personal history of colonic polyps: Secondary | ICD-10-CM | POA: Diagnosis not present

## 2021-11-20 DIAGNOSIS — E559 Vitamin D deficiency, unspecified: Secondary | ICD-10-CM | POA: Diagnosis not present

## 2021-11-20 DIAGNOSIS — M1990 Unspecified osteoarthritis, unspecified site: Secondary | ICD-10-CM | POA: Diagnosis not present

## 2021-11-20 DIAGNOSIS — E7800 Pure hypercholesterolemia, unspecified: Secondary | ICD-10-CM | POA: Diagnosis not present

## 2021-11-20 DIAGNOSIS — H43813 Vitreous degeneration, bilateral: Secondary | ICD-10-CM | POA: Diagnosis not present

## 2021-11-20 DIAGNOSIS — F4323 Adjustment disorder with mixed anxiety and depressed mood: Secondary | ICD-10-CM | POA: Diagnosis not present

## 2021-11-20 DIAGNOSIS — I2510 Atherosclerotic heart disease of native coronary artery without angina pectoris: Secondary | ICD-10-CM | POA: Diagnosis not present

## 2021-11-20 DIAGNOSIS — E871 Hypo-osmolality and hyponatremia: Secondary | ICD-10-CM | POA: Diagnosis not present

## 2021-11-20 DIAGNOSIS — R339 Retention of urine, unspecified: Secondary | ICD-10-CM | POA: Diagnosis not present

## 2021-11-20 DIAGNOSIS — M62830 Muscle spasm of back: Secondary | ICD-10-CM | POA: Diagnosis not present

## 2021-11-20 DIAGNOSIS — M791 Myalgia, unspecified site: Secondary | ICD-10-CM | POA: Diagnosis not present

## 2021-11-20 DIAGNOSIS — Z792 Long term (current) use of antibiotics: Secondary | ICD-10-CM | POA: Diagnosis not present

## 2021-11-20 DIAGNOSIS — I10 Essential (primary) hypertension: Secondary | ICD-10-CM | POA: Diagnosis not present

## 2021-11-20 DIAGNOSIS — U071 COVID-19: Secondary | ICD-10-CM | POA: Diagnosis not present

## 2021-11-20 DIAGNOSIS — H353132 Nonexudative age-related macular degeneration, bilateral, intermediate dry stage: Secondary | ICD-10-CM | POA: Diagnosis not present

## 2021-11-20 DIAGNOSIS — H02834 Dermatochalasis of left upper eyelid: Secondary | ICD-10-CM | POA: Diagnosis not present

## 2021-11-23 DIAGNOSIS — I10 Essential (primary) hypertension: Secondary | ICD-10-CM | POA: Diagnosis not present

## 2021-11-23 DIAGNOSIS — E871 Hypo-osmolality and hyponatremia: Secondary | ICD-10-CM | POA: Diagnosis not present

## 2021-11-23 DIAGNOSIS — R351 Nocturia: Secondary | ICD-10-CM | POA: Diagnosis not present

## 2021-12-04 DIAGNOSIS — R339 Retention of urine, unspecified: Secondary | ICD-10-CM | POA: Diagnosis not present

## 2021-12-04 DIAGNOSIS — I10 Essential (primary) hypertension: Secondary | ICD-10-CM | POA: Diagnosis not present

## 2021-12-04 DIAGNOSIS — Z86010 Personal history of colonic polyps: Secondary | ICD-10-CM | POA: Diagnosis not present

## 2021-12-04 DIAGNOSIS — K529 Noninfective gastroenteritis and colitis, unspecified: Secondary | ICD-10-CM | POA: Diagnosis not present

## 2021-12-04 DIAGNOSIS — E559 Vitamin D deficiency, unspecified: Secondary | ICD-10-CM | POA: Diagnosis not present

## 2021-12-04 DIAGNOSIS — I2510 Atherosclerotic heart disease of native coronary artery without angina pectoris: Secondary | ICD-10-CM | POA: Diagnosis not present

## 2021-12-04 DIAGNOSIS — E7800 Pure hypercholesterolemia, unspecified: Secondary | ICD-10-CM | POA: Diagnosis not present

## 2021-12-04 DIAGNOSIS — M810 Age-related osteoporosis without current pathological fracture: Secondary | ICD-10-CM | POA: Diagnosis not present

## 2021-12-04 DIAGNOSIS — M6281 Muscle weakness (generalized): Secondary | ICD-10-CM | POA: Diagnosis not present

## 2021-12-04 DIAGNOSIS — H6123 Impacted cerumen, bilateral: Secondary | ICD-10-CM | POA: Diagnosis not present

## 2021-12-04 DIAGNOSIS — M62830 Muscle spasm of back: Secondary | ICD-10-CM | POA: Diagnosis not present

## 2021-12-04 DIAGNOSIS — N390 Urinary tract infection, site not specified: Secondary | ICD-10-CM | POA: Diagnosis not present

## 2021-12-04 DIAGNOSIS — H353132 Nonexudative age-related macular degeneration, bilateral, intermediate dry stage: Secondary | ICD-10-CM | POA: Diagnosis not present

## 2021-12-04 DIAGNOSIS — H43813 Vitreous degeneration, bilateral: Secondary | ICD-10-CM | POA: Diagnosis not present

## 2021-12-04 DIAGNOSIS — F4323 Adjustment disorder with mixed anxiety and depressed mood: Secondary | ICD-10-CM | POA: Diagnosis not present

## 2021-12-04 DIAGNOSIS — M791 Myalgia, unspecified site: Secondary | ICD-10-CM | POA: Diagnosis not present

## 2021-12-04 DIAGNOSIS — M1990 Unspecified osteoarthritis, unspecified site: Secondary | ICD-10-CM | POA: Diagnosis not present

## 2021-12-04 DIAGNOSIS — U071 COVID-19: Secondary | ICD-10-CM | POA: Diagnosis not present

## 2021-12-04 DIAGNOSIS — G629 Polyneuropathy, unspecified: Secondary | ICD-10-CM | POA: Diagnosis not present

## 2021-12-04 DIAGNOSIS — E871 Hypo-osmolality and hyponatremia: Secondary | ICD-10-CM | POA: Diagnosis not present

## 2021-12-04 DIAGNOSIS — Z792 Long term (current) use of antibiotics: Secondary | ICD-10-CM | POA: Diagnosis not present

## 2021-12-04 DIAGNOSIS — H02834 Dermatochalasis of left upper eyelid: Secondary | ICD-10-CM | POA: Diagnosis not present

## 2021-12-04 DIAGNOSIS — H02831 Dermatochalasis of right upper eyelid: Secondary | ICD-10-CM | POA: Diagnosis not present

## 2021-12-09 DIAGNOSIS — N390 Urinary tract infection, site not specified: Secondary | ICD-10-CM | POA: Diagnosis not present

## 2021-12-09 DIAGNOSIS — I2510 Atherosclerotic heart disease of native coronary artery without angina pectoris: Secondary | ICD-10-CM | POA: Diagnosis not present

## 2021-12-09 DIAGNOSIS — M810 Age-related osteoporosis without current pathological fracture: Secondary | ICD-10-CM | POA: Diagnosis not present

## 2021-12-09 DIAGNOSIS — H02834 Dermatochalasis of left upper eyelid: Secondary | ICD-10-CM | POA: Diagnosis not present

## 2021-12-09 DIAGNOSIS — E871 Hypo-osmolality and hyponatremia: Secondary | ICD-10-CM | POA: Diagnosis not present

## 2021-12-09 DIAGNOSIS — M62830 Muscle spasm of back: Secondary | ICD-10-CM | POA: Diagnosis not present

## 2021-12-09 DIAGNOSIS — Z792 Long term (current) use of antibiotics: Secondary | ICD-10-CM | POA: Diagnosis not present

## 2021-12-09 DIAGNOSIS — R339 Retention of urine, unspecified: Secondary | ICD-10-CM | POA: Diagnosis not present

## 2021-12-09 DIAGNOSIS — H353132 Nonexudative age-related macular degeneration, bilateral, intermediate dry stage: Secondary | ICD-10-CM | POA: Diagnosis not present

## 2021-12-09 DIAGNOSIS — H02831 Dermatochalasis of right upper eyelid: Secondary | ICD-10-CM | POA: Diagnosis not present

## 2021-12-09 DIAGNOSIS — M6281 Muscle weakness (generalized): Secondary | ICD-10-CM | POA: Diagnosis not present

## 2021-12-09 DIAGNOSIS — M791 Myalgia, unspecified site: Secondary | ICD-10-CM | POA: Diagnosis not present

## 2021-12-09 DIAGNOSIS — U071 COVID-19: Secondary | ICD-10-CM | POA: Diagnosis not present

## 2021-12-09 DIAGNOSIS — M1990 Unspecified osteoarthritis, unspecified site: Secondary | ICD-10-CM | POA: Diagnosis not present

## 2021-12-09 DIAGNOSIS — G629 Polyneuropathy, unspecified: Secondary | ICD-10-CM | POA: Diagnosis not present

## 2021-12-09 DIAGNOSIS — Z86010 Personal history of colonic polyps: Secondary | ICD-10-CM | POA: Diagnosis not present

## 2021-12-09 DIAGNOSIS — K529 Noninfective gastroenteritis and colitis, unspecified: Secondary | ICD-10-CM | POA: Diagnosis not present

## 2021-12-09 DIAGNOSIS — F4323 Adjustment disorder with mixed anxiety and depressed mood: Secondary | ICD-10-CM | POA: Diagnosis not present

## 2021-12-09 DIAGNOSIS — I10 Essential (primary) hypertension: Secondary | ICD-10-CM | POA: Diagnosis not present

## 2021-12-09 DIAGNOSIS — H6123 Impacted cerumen, bilateral: Secondary | ICD-10-CM | POA: Diagnosis not present

## 2021-12-09 DIAGNOSIS — E7800 Pure hypercholesterolemia, unspecified: Secondary | ICD-10-CM | POA: Diagnosis not present

## 2021-12-09 DIAGNOSIS — H43813 Vitreous degeneration, bilateral: Secondary | ICD-10-CM | POA: Diagnosis not present

## 2021-12-09 DIAGNOSIS — E559 Vitamin D deficiency, unspecified: Secondary | ICD-10-CM | POA: Diagnosis not present

## 2021-12-15 DIAGNOSIS — H353132 Nonexudative age-related macular degeneration, bilateral, intermediate dry stage: Secondary | ICD-10-CM | POA: Diagnosis not present

## 2021-12-15 DIAGNOSIS — Z792 Long term (current) use of antibiotics: Secondary | ICD-10-CM | POA: Diagnosis not present

## 2021-12-15 DIAGNOSIS — F4323 Adjustment disorder with mixed anxiety and depressed mood: Secondary | ICD-10-CM | POA: Diagnosis not present

## 2021-12-15 DIAGNOSIS — M62830 Muscle spasm of back: Secondary | ICD-10-CM | POA: Diagnosis not present

## 2021-12-15 DIAGNOSIS — K529 Noninfective gastroenteritis and colitis, unspecified: Secondary | ICD-10-CM | POA: Diagnosis not present

## 2021-12-15 DIAGNOSIS — E871 Hypo-osmolality and hyponatremia: Secondary | ICD-10-CM | POA: Diagnosis not present

## 2021-12-15 DIAGNOSIS — M810 Age-related osteoporosis without current pathological fracture: Secondary | ICD-10-CM | POA: Diagnosis not present

## 2021-12-15 DIAGNOSIS — H6123 Impacted cerumen, bilateral: Secondary | ICD-10-CM | POA: Diagnosis not present

## 2021-12-15 DIAGNOSIS — R339 Retention of urine, unspecified: Secondary | ICD-10-CM | POA: Diagnosis not present

## 2021-12-15 DIAGNOSIS — H02834 Dermatochalasis of left upper eyelid: Secondary | ICD-10-CM | POA: Diagnosis not present

## 2021-12-15 DIAGNOSIS — M6281 Muscle weakness (generalized): Secondary | ICD-10-CM | POA: Diagnosis not present

## 2021-12-15 DIAGNOSIS — I2510 Atherosclerotic heart disease of native coronary artery without angina pectoris: Secondary | ICD-10-CM | POA: Diagnosis not present

## 2021-12-15 DIAGNOSIS — N390 Urinary tract infection, site not specified: Secondary | ICD-10-CM | POA: Diagnosis not present

## 2021-12-15 DIAGNOSIS — G629 Polyneuropathy, unspecified: Secondary | ICD-10-CM | POA: Diagnosis not present

## 2021-12-15 DIAGNOSIS — E559 Vitamin D deficiency, unspecified: Secondary | ICD-10-CM | POA: Diagnosis not present

## 2021-12-15 DIAGNOSIS — E7800 Pure hypercholesterolemia, unspecified: Secondary | ICD-10-CM | POA: Diagnosis not present

## 2021-12-15 DIAGNOSIS — U071 COVID-19: Secondary | ICD-10-CM | POA: Diagnosis not present

## 2021-12-15 DIAGNOSIS — Z86010 Personal history of colonic polyps: Secondary | ICD-10-CM | POA: Diagnosis not present

## 2021-12-15 DIAGNOSIS — H02831 Dermatochalasis of right upper eyelid: Secondary | ICD-10-CM | POA: Diagnosis not present

## 2021-12-15 DIAGNOSIS — M1990 Unspecified osteoarthritis, unspecified site: Secondary | ICD-10-CM | POA: Diagnosis not present

## 2021-12-15 DIAGNOSIS — M791 Myalgia, unspecified site: Secondary | ICD-10-CM | POA: Diagnosis not present

## 2021-12-15 DIAGNOSIS — H43813 Vitreous degeneration, bilateral: Secondary | ICD-10-CM | POA: Diagnosis not present

## 2021-12-15 DIAGNOSIS — I10 Essential (primary) hypertension: Secondary | ICD-10-CM | POA: Diagnosis not present

## 2021-12-25 DIAGNOSIS — R040 Epistaxis: Secondary | ICD-10-CM | POA: Diagnosis not present

## 2021-12-25 DIAGNOSIS — R5383 Other fatigue: Secondary | ICD-10-CM | POA: Diagnosis not present

## 2021-12-25 DIAGNOSIS — E0590 Thyrotoxicosis, unspecified without thyrotoxic crisis or storm: Secondary | ICD-10-CM | POA: Diagnosis not present

## 2021-12-25 DIAGNOSIS — R930 Abnormal findings on diagnostic imaging of skull and head, not elsewhere classified: Secondary | ICD-10-CM | POA: Diagnosis not present

## 2021-12-28 DIAGNOSIS — Z0100 Encounter for examination of eyes and vision without abnormal findings: Secondary | ICD-10-CM | POA: Diagnosis not present

## 2022-03-09 IMAGING — DX DG CHEST 1V PORT
1 series · 1 of 1 positions shown · non-contrast
Comparison: None.

CLINICAL DATA: Cough

EXAM:
PORTABLE CHEST 1 VIEW

[chest ap]
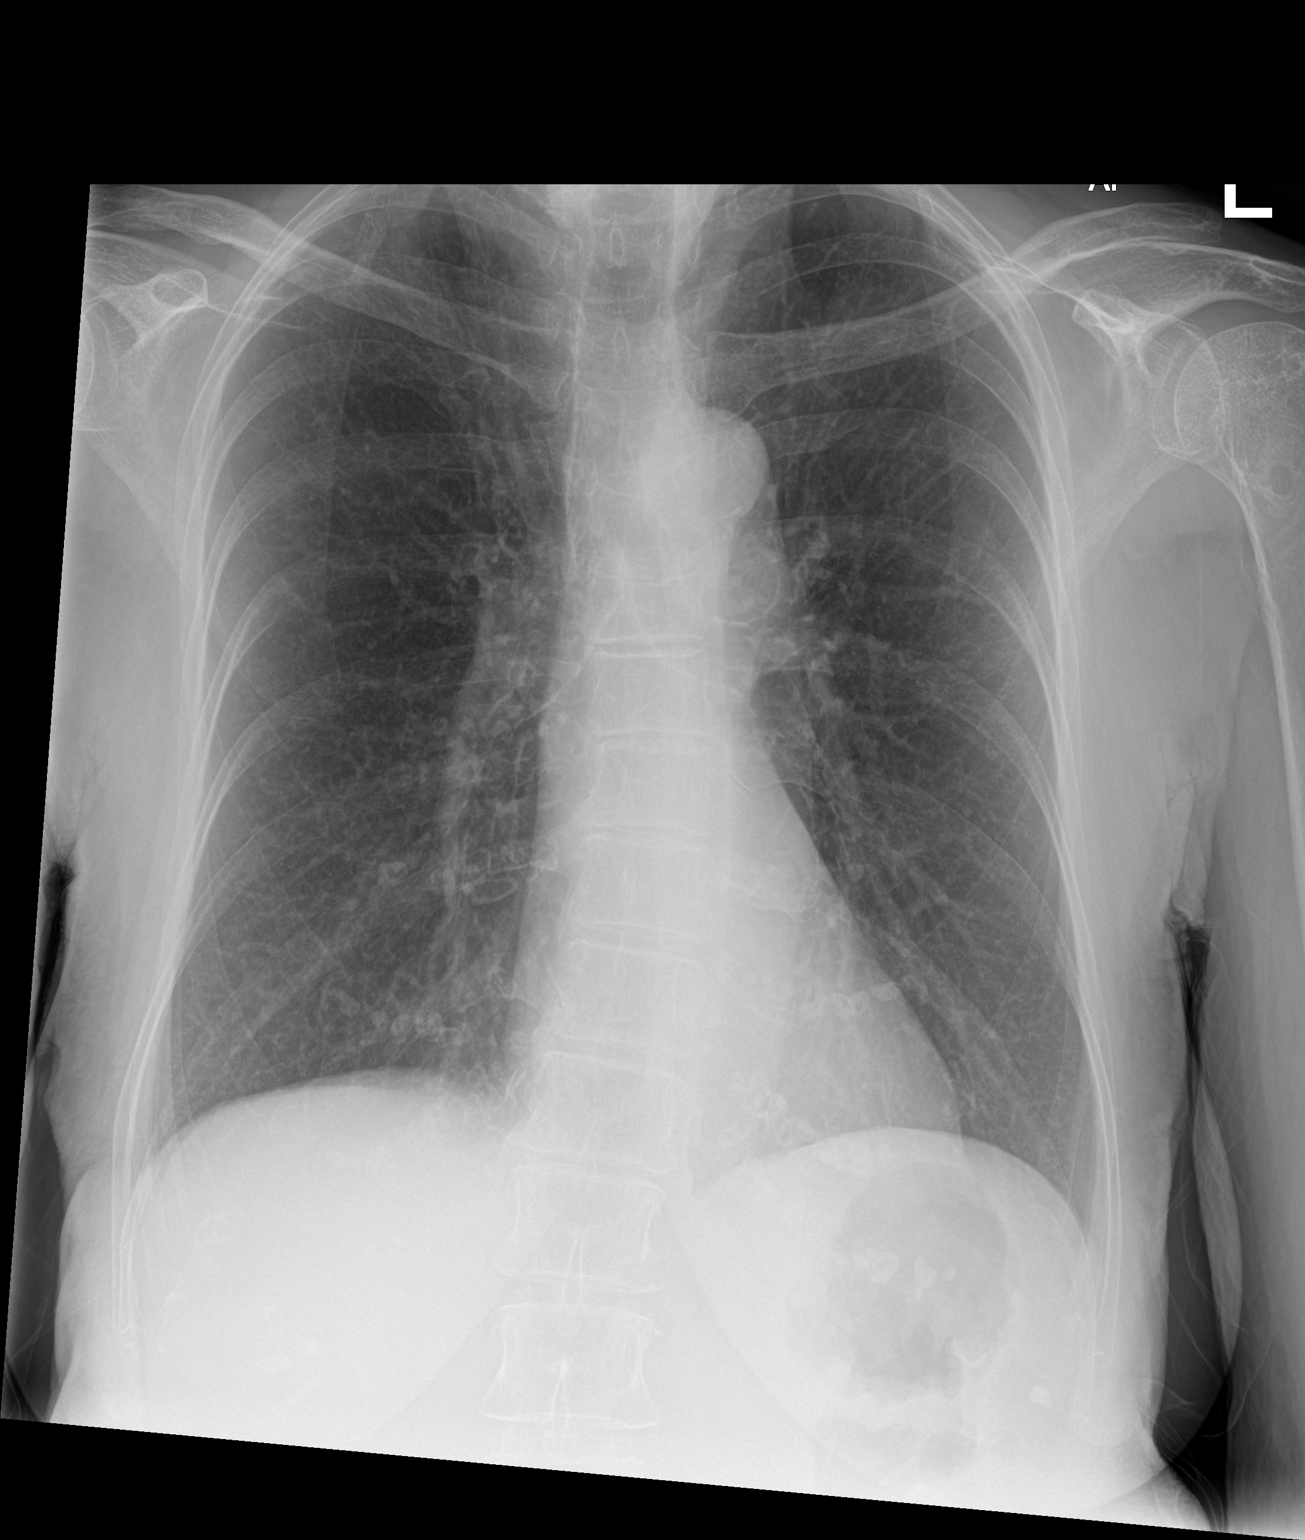

[1 of 1 positions shown; findings below may reference images not displayed]

FINDINGS: The heart size and mediastinal contours are within normal limits.
Both lungs are clear. No pleural effusion or pneumothorax. The
visualized skeletal structures are unremarkable.
IMPRESSION: No acute process in the chest.

## 2022-03-10 DIAGNOSIS — D692 Other nonthrombocytopenic purpura: Secondary | ICD-10-CM | POA: Diagnosis not present

## 2022-03-10 DIAGNOSIS — Z85828 Personal history of other malignant neoplasm of skin: Secondary | ICD-10-CM | POA: Diagnosis not present

## 2022-03-15 DIAGNOSIS — Z789 Other specified health status: Secondary | ICD-10-CM | POA: Diagnosis not present

## 2022-03-15 DIAGNOSIS — U099 Post covid-19 condition, unspecified: Secondary | ICD-10-CM | POA: Diagnosis not present

## 2022-03-15 DIAGNOSIS — R9089 Other abnormal findings on diagnostic imaging of central nervous system: Secondary | ICD-10-CM | POA: Diagnosis not present

## 2022-03-15 DIAGNOSIS — R4789 Other speech disturbances: Secondary | ICD-10-CM | POA: Diagnosis not present

## 2022-03-23 DIAGNOSIS — R351 Nocturia: Secondary | ICD-10-CM | POA: Diagnosis not present

## 2022-04-29 ENCOUNTER — Encounter: Payer: Self-pay | Admitting: Neurology

## 2022-04-29 ENCOUNTER — Ambulatory Visit: Payer: Medicare HMO | Admitting: Neurology

## 2022-04-29 VITALS — BP 155/82 | HR 92 | Ht 60.0 in | Wt 91.5 lb

## 2022-04-29 DIAGNOSIS — R413 Other amnesia: Secondary | ICD-10-CM

## 2022-04-29 DIAGNOSIS — I629 Nontraumatic intracranial hemorrhage, unspecified: Secondary | ICD-10-CM | POA: Diagnosis not present

## 2022-04-29 MED ORDER — TRAZODONE HCL 50 MG PO TABS: 50.0000 mg | ORAL_TABLET | Freq: Every day | ORAL | 11 refills | Status: DC

## 2022-04-29 NOTE — Progress Notes (Signed)
Chief Complaint  Patient presents with   New Patient (Initial Visit)    Rm EMG/NCV 4. Accompanied by husband, Timmothy Sours. NP/Paper/John Tipton MD [facility] Physical Therapy/memory concerns. [place] 24/30.      ASSESSMENT AND PLAN  Sherri Parks is a 76 y.o. 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   MEDICAL HISTORY:  Sherri Parks, is a 76 year old female, accompanied by her husband, seen in request by her primary care physician Dr. [place]own Desanctis, Gwynn Burly, for evaluation of memory loss, initial evaluation was on Apr 29, 2022  I reviewed and summarized the referring note. PMHx. 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, very organized, exercise regularly, she does have family history of memory loss, mother in her late 64s, and sister in her 55s suffered Alzheimer's disease  She noted gradual onset of memory loss over the past couple years, most noticeable since she recovering from her COVID infection in September 2022, she complains of word finding difficulties, forget conversation, repeating herself, also have depression, crying easily, poor sleep quality, woke up 3-4 times to use the bathroom,  Husband reported that she continues to be busy, taking  care of her household chores, noted mild hearing loss  In October 2022, followed complaining of frequent headaches, MRI at [facility] on September 16, 2021 showed early subacute hemorrhage in the left occipital lobe, measuring up to 10 mm, in addition, there is 9 mm area of late subacute hemorrhage in the left frontal lobe, diffuse foci of susceptibility, consistent with additional more remote hemorrhages, 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  PHYSICAL EXAMNIATION:  Gen: NAD, conversant, well nourised, well groomed                     Cardiovascular: Regular rate rhythm, no peripheral edema, warm, nontender. Eyes: Conjunctivae clear without exudates or hemorrhage Neck: Supple, no carotid bruits. Pulmonary: Clear to auscultation bilaterally   NEUROLOGICAL EXAM:  MENTAL STATUS: Speech/cognition: Awake, alert, oriented to history taking and casual conversation     04/29/2022    3:36 PM  Montreal Cognitive Assessment   Visuospatial/ Executive (0/5) 4  Naming (0/3) 2  Attention: Read list of digits (0/2) 2  Attention: Read list of letters (0/1) 1  Attention: Serial 7 subtraction starting at 100 (0/3) 0  Language: Repeat phrase (0/2) 2  Language : Fluency (0/1) 0  Abstraction (0/2) 2  Delayed Recall (0/5) 5  Orientation (0/6) 6  Total 24  Adjusted Score (based on education) 24    CRANIAL  NERVES: CN II: Visual fields are full to confrontation. Pupils are round equal and briskly reactive to light. CN III, IV, VI: extraocular movement are normal. No ptosis. CN V: Facial sensation is intact to light touch CN VII: Face is symmetric with normal eye closure  CN VIII: Hearing is normal to causal conversation. CN IX, X: Phonation is normal. CN XI: Head turning and shoulder shrug are  intact  MOTOR: There is no pronator drift of out-stretched arms. Muscle bulk and tone are normal. Muscle strength is normal.  REFLEXES: Reflexes are 2+ and symmetric at the biceps, triceps, knees, and ankles. Plantar responses are flexor.  SENSORY: Intact to light touch, pinprick and vibratory sensation are intact in fingers and toes.  COORDINATION: There is no trunk or limb dysmetria noted.  GAIT/STANCE: Posture is normal. Gait is steady with normal steps, base, arm swing, and turning. Heel and toe walking are normal. Tandem gait is normal.  Romberg is absent.  REVIEW OF SYSTEMS:  Full 14 system review of systems performed and notable only for as above All other review of systems were negative.   ALLERGIES: Allergies  Allergen Reactions   Amoxicillin Nausea And Vomiting    HOME MEDICATIONS: Current Outpatient Medications  Medication Sig Dispense Refill   fish oil-omega-3 fatty acids 1000 MG capsule Take 1 g by mouth daily.     Magnesium 200 MG CHEW Chew by mouth.     Multiple Vitamins-Minerals (PRESERVISION AREDS 2 PO) Take by mouth.     Probiotic Product (PROBIOTIC ADVANCED PO) Take by mouth.     simvastatin (ZOCOR) 20 MG tablet Take 20 mg by mouth at bedtime.     Ubiquinol 100 MG CAPS Take by mouth.     No current facility-administered medications for this visit.    PAST MEDICAL HISTORY: Past Medical History:  Diagnosis Date   Hypercholesteremia    Hypertension    Murmur, cardiac    Osteoporosis     PAST SURGICAL HISTORY: Past Surgical History:  Procedure Laterality Date   APPENDECTOMY     CESAREAN SECTION     ROTATOR CUFF REPAIR      FAMILY HISTORY: History reviewed. No pertinent family history.  SOCIAL HISTORY: Social History   Socioeconomic History   Marital status: Married    Spouse name: Not on file   Number of children: Not on file   Years of education: Not on file   Highest education level: Not on file  Occupational History   Not on  file  Tobacco Use   Smoking status: Never   Smokeless tobacco: Not on file  Substance and Sexual Activity   Alcohol use: Yes   Drug use: No   Sexual activity: Not on file  Other Topics Concern   Not on file  Social History Narrative   Not on file   Social Determinants of Health   Financial Resource Strain: Not on file  Food Insecurity: Not on file  Transportation Needs: Not on file  Physical Activity: Not on file  Stress: Not on file  Social Connections: Not on file  Intimate Partner Violence: Not on file      Marcial Pacas, M.D. Ph.D.  [facility] [address], [place], [place] [address] Ph: [phone] Fax: [phone]  CC:  Tamala Julian, MD [address] [place],  [place] [address]  Tamala Julian, MD

## 2022-04-30 LAB — C-REACTIVE PROTEIN: CRP: <1 mg/L (ref 0–10)

## 2022-04-30 LAB — VITAMIN B12: Vitamin B-12: 389 pg/mL (ref 232–1245)

## 2022-04-30 LAB — ANA W/REFLEX IF POSITIVE: Anti Nuclear Antibody (ANA): NEGATIVE

## 2022-04-30 LAB — RPR: RPR Ser Ql: NONREACTIVE

## 2022-04-30 LAB — CK: Total CK: 117 U/L (ref 32–182)

## 2022-04-30 LAB — SEDIMENTATION RATE: Sed Rate: 2 mm/hr (ref 0–40)

## 2022-05-04 ENCOUNTER — Telehealth: Payer: Self-pay | Admitting: *Deleted

## 2022-05-04 ENCOUNTER — Telehealth: Payer: Self-pay | Admitting: Neurology

## 2022-05-04 NOTE — Telephone Encounter (Signed)
[employer] medicare sent to GI they obtain auth and call patient to schedule

## 2022-05-04 NOTE — Telephone Encounter (Signed)
I spoke to the patient and provided her with the lab results. Agreeable to start the recommended supplement. She wrote the dosage down.

## 2022-05-04 NOTE — Telephone Encounter (Signed)
-----   Message from Marcial Pacas, MD sent at 05/03/2022  7:54 PM EDT ----- Please call patient, laboratory evaluation showed no significant abnormalities.  B12 level was within low normal range.  She would benefit over-the-counter B12 supplement, 1000 mcg daily.

## 2022-05-11 ENCOUNTER — Ambulatory Visit: Payer: Medicare HMO | Admitting: Neurology

## 2022-05-11 DIAGNOSIS — I629 Nontraumatic intracranial hemorrhage, unspecified: Secondary | ICD-10-CM | POA: Diagnosis not present

## 2022-05-11 DIAGNOSIS — R413 Other amnesia: Secondary | ICD-10-CM

## 2022-05-18 ENCOUNTER — Ambulatory Visit
Admission: RE | Admit: 2022-05-18 | Discharge: 2022-05-18 | Disposition: A | Discharge status: Home / Self Care | Payer: Medicare HMO | Source: Ambulatory Visit | Attending: Neurology | Admitting: Neurology

## 2022-05-18 DIAGNOSIS — I629 Nontraumatic intracranial hemorrhage, unspecified: Secondary | ICD-10-CM | POA: Diagnosis not present

## 2022-05-18 DIAGNOSIS — R413 Other amnesia: Secondary | ICD-10-CM | POA: Diagnosis not present

## 2022-05-20 DIAGNOSIS — H35363 Drusen (degenerative) of macula, bilateral: Secondary | ICD-10-CM | POA: Diagnosis not present

## 2022-05-20 DIAGNOSIS — H3554 Dystrophies primarily involving the retinal pigment epithelium: Secondary | ICD-10-CM | POA: Diagnosis not present

## 2022-05-20 DIAGNOSIS — H35372 Puckering of macula, left eye: Secondary | ICD-10-CM | POA: Diagnosis not present

## 2022-05-20 DIAGNOSIS — H353132 Nonexudative age-related macular degeneration, bilateral, intermediate dry stage: Secondary | ICD-10-CM | POA: Diagnosis not present

## 2022-05-24 ENCOUNTER — Telehealth: Payer: Self-pay

## 2022-05-24 NOTE — Telephone Encounter (Signed)
Patient called office requesting MRI & EEG results. Please call as soon as they are available. She is anxious to discuss them.

## 2022-05-25 ENCOUNTER — Telehealth: Payer: Self-pay | Admitting: Neurology

## 2022-05-25 DIAGNOSIS — I629 Nontraumatic intracranial hemorrhage, unspecified: Secondary | ICD-10-CM

## 2022-05-25 NOTE — Telephone Encounter (Signed)
I spoke to the patient. She has been informed of her MRI results. She is agreeable to move forward with the ordered tests. She is aware to expect a call for scheduling.

## 2022-05-25 NOTE — Telephone Encounter (Signed)
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

## 2022-05-31 ENCOUNTER — Telehealth: Payer: Self-pay | Admitting: Neurology

## 2022-05-31 NOTE — Telephone Encounter (Signed)
Pt called needing to speak to the RN regarding her EEG results that she is still waiting for and her MRI results that she has already received. Please advise.

## 2022-05-31 NOTE — Telephone Encounter (Signed)
I sent a message to Sherri Parks at the hospital to scheduled echo and carotid

## 2022-05-31 NOTE — Telephone Encounter (Signed)
I spoke to the patient. Reviewed MRI brain results again while her husband was present.  She would like to get her EEG results.  She has not received a phone call yet for the ordered ECHO & carotid ultrasound.

## 2022-06-01 NOTE — Telephone Encounter (Signed)
Separate call with this concern.

## 2022-06-01 NOTE — Procedures (Signed)
   HISTORY: 76 year old female, memory loss, intermittent increased confusion  TECHNIQUE:  This is a routine 16 channel EEG recording with one channel devoted to a limited EKG recording.  It was performed during wakefulness, drowsiness and asleep.  Hyperventilation and photic stimulation were performed as activating procedures.  There are minimum muscle and movement artifact noted.  Upon maximum arousal, posterior dominant waking rhythm consistent of mildly dysrhythmic alpha range activity. Activities are symmetric over the bilateral posterior derivations and attenuated with eye opening.  Hyperventilation produced mild/moderate buildup with higher amplitude and the slower activities noted.  Possible hyperventilation and during drowsiness, there was short lasting bifrontal predominant high amplitude slow activity in the delta range.  Photic stimulation did not alter the tracing.  During EEG recording, patient developed drowsiness and no deeper stage was achieved  During EEG recording, there was no epileptiform discharge noted.  EKG demonstrate sinus rhythm, with heart rate of 96 bpm  CONCLUSION: This is a slight abnormal EEG.  There is no electrodiagnostic evidence of epileptiform discharge.  But there is evidence of hyper synchronized short lasting high amplitude slow activity, indicating mild bihemispheric malfunction.  Marcial Pacas, M.D. Ph.D.  [facility] [place], [place] [address] Phone: [phone] Fax:      [phone]

## 2022-06-02 NOTE — Telephone Encounter (Signed)
Left message for a return call

## 2022-06-02 NOTE — Telephone Encounter (Signed)
Please call patient, EEG showed mild intermittent background slowing, indicating mild bihemispheric malfunction, correlate with her reported history of memory loss  MRI of the brain showed advanced small vessel disease, with evidence of cerebral atrophy, also evidence of previous hemorrhage at the right side and the left cortical region  If patient and her husband has more questions, I can see them for virtual visit to review films, and the EEG, or move up the in person visit   IMPRESSION: MRI scan of the brain without contrast showing areas of remote age hemorrhagic blood products and gliosis in the right parieto-occipital and left frontal cortical regions.  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

## 2022-06-02 NOTE — Telephone Encounter (Signed)
I spoke to the patient and provided her with the EEG results. We have already discussed the MRI findings.   US carotid and ECHO pending on 06/07/22.   She wishes to have a follow a follow up with Dr. Krista Blue so she can review all the tests in detail.  Scheduled 06/22/22 at 3:30pm. She will arrived to our office at 3pm for check-in.

## 2022-06-07 ENCOUNTER — Ambulatory Visit (HOSPITAL_COMMUNITY)
Admission: RE | Admit: 2022-06-07 | Discharge: 2022-06-07 | Disposition: A | Discharge status: Home / Self Care | Payer: Medicare HMO | Source: Ambulatory Visit | Attending: Neurology | Admitting: Neurology

## 2022-06-07 ENCOUNTER — Ambulatory Visit (HOSPITAL_BASED_OUTPATIENT_CLINIC_OR_DEPARTMENT_OTHER)
Admission: RE | Admit: 2022-06-07 | Discharge: 2022-06-07 | Disposition: A | Discharge status: Home / Self Care | Payer: Medicare HMO | Source: Ambulatory Visit | Attending: Neurology | Admitting: Neurology

## 2022-06-07 DIAGNOSIS — E785 Hyperlipidemia, unspecified: Secondary | ICD-10-CM | POA: Diagnosis not present

## 2022-06-07 DIAGNOSIS — I6523 Occlusion and stenosis of bilateral carotid arteries: Secondary | ICD-10-CM | POA: Diagnosis not present

## 2022-06-07 DIAGNOSIS — R9431 Abnormal electrocardiogram [ECG] [EKG]: Secondary | ICD-10-CM

## 2022-06-07 DIAGNOSIS — I629 Nontraumatic intracranial hemorrhage, unspecified: Secondary | ICD-10-CM

## 2022-06-07 LAB — ECHOCARDIOGRAM COMPLETE
AR max vel: 2.65 cm2
AV Peak grad: 7.3 mmHg
Ao pk vel: 1.35 m/s
Area-P 1/2: 4.29 cm2
Calc EF: 62.5 %
MV M vel: 4.28 m/s
MV Peak grad: 73.3 mmHg
S' Lateral: 2.1 cm
Single Plane A2C EF: 65.9 %
Single Plane A4C EF: 60.9 %

## 2022-06-08 ENCOUNTER — Telehealth: Payer: Self-pay | Admitting: *Deleted

## 2022-06-09 NOTE — Telephone Encounter (Signed)
I spoke with the patient and informed her of the results. She will continue on current medication. She will follow up with Dr. Krista Blue on 06/23/2022. Pt verbalized appreciation for the call.

## 2022-06-10 ENCOUNTER — Telehealth: Payer: Self-pay | Admitting: *Deleted

## 2022-06-10 NOTE — Telephone Encounter (Signed)
-----   Message from Marcial Pacas, MD sent at 06/10/2022  8:35 AM EDT ----- Please call patient, ultrasound of carotid artery showed only minimal wall thickening.  Please continue current management

## 2022-06-10 NOTE — Telephone Encounter (Signed)
I called the pt and reviewed results. She verbalized understanding and appreciation for the call. She reports she has had trouble with her mychart account and would like for someone to help her with this the day of her f/u appt on 7/12. I advised we should be able to take a look at this for her and see if we can get her online. She alreay has a [facility] account.

## 2022-06-14 DIAGNOSIS — R4789 Other speech disturbances: Secondary | ICD-10-CM | POA: Diagnosis not present

## 2022-06-14 DIAGNOSIS — R9089 Other abnormal findings on diagnostic imaging of central nervous system: Secondary | ICD-10-CM | POA: Diagnosis not present

## 2022-06-14 DIAGNOSIS — Z789 Other specified health status: Secondary | ICD-10-CM | POA: Diagnosis not present

## 2022-06-23 ENCOUNTER — Ambulatory Visit (INDEPENDENT_AMBULATORY_CARE_PROVIDER_SITE_OTHER): Payer: Medicare HMO | Admitting: Neurology

## 2022-06-23 ENCOUNTER — Encounter: Payer: Self-pay | Admitting: Neurology

## 2022-06-23 VITALS — BP 151/80 | HR 89 | Ht 60.0 in | Wt 88.5 lb

## 2022-06-23 DIAGNOSIS — I629 Nontraumatic intracranial hemorrhage, unspecified: Secondary | ICD-10-CM

## 2022-06-23 DIAGNOSIS — I679 Cerebrovascular disease, unspecified: Secondary | ICD-10-CM | POA: Diagnosis not present

## 2022-06-23 DIAGNOSIS — R413 Other amnesia: Secondary | ICD-10-CM | POA: Diagnosis not present

## 2022-06-23 MED ORDER — QUETIAPINE FUMARATE 25 MG PO TABS: 25.0000 mg | ORAL_TABLET | Freq: Every day | ORAL | 5 refills | Status: DC

## 2022-06-23 NOTE — Progress Notes (Unsigned)
Chief Complaint  Patient presents with   Follow-up    Room 14 w/ husband, Timmothy Sours. Hx of intracranial bleeding/memory loss. They would like to discuss test results today. Recent MOCA 24/30. She stopped trazodone 53m. Even taking 0.5 tablet QHS was causing drowsiness the following morning. She is now using OTC melatonin.       ASSESSMENT AND PLAN  Sherri Kubisiakis a 76y.o. 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   MEDICAL HISTORY:  Sherri Parks is a 76year old female, accompanied by her husband, seen in request by her primary care physician Dr. TWesley Desanctis JGwynn Burly for evaluation of memory loss, initial evaluation was on Apr 29, 2022  I reviewed and summarized the referring note. PMHx. 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 very organized, exercise regularly, she does have family history of memory loss, mother in her late 981s and sister in her 77ssuffered Alzheimer's disease  She noted gradual onset of memory loss over the past couple years, most noticeable since she recovering from her COVID infection in September 2022, she complains of word finding difficulties, forget conversation, repeating herself, also  have depression, crying easily, poor sleep quality, woke up 3-4 times to use the bathroom,  Husband reported that she continues to be busy, taking care of her household chores, noted mild hearing loss  In October 2022, followed complaining of frequent headaches, [facility] showed early subacute hemorrhage in the left occipital lobe, measuring up to 10 mm, in addition, there is 9 mm area of late subacute hemorrhage in the left frontal lobe, diffuse foci of susceptibility, consistent with additional more remote hemorrhages, 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  UPDATE June 23 2022: Patient is tearful during today's visit, continue complains of anxiety difficulty sleeping, very frustrated about her memory loss  Personally reviewed MRI of the brain without contrast June 2023, hemorrhagic product in the right parietal occipital, left frontal cortical region, cerebral atrophy, extensive small vessel disease, 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  PHYSICAL EXAMNIATION:  Gen: NAD, conversant, well nourised, well groomed                     Cardiovascular: Regular rate rhythm, no peripheral edema, warm, nontender.  Eyes: Conjunctivae clear without exudates or hemorrhage Neck: Supple, no carotid bruits. Pulmonary: Clear to auscultation bilaterally   NEUROLOGICAL EXAM:  MENTAL STATUS: Speech/cognition: Awake, alert, oriented to history taking and casual conversation     04/29/2022    3:36 PM   Montreal Cognitive Assessment   Visuospatial/ Executive (0/5) 4  Naming (0/3) 2  Attention: Read list of digits (0/2) 2  Attention: Read list of letters (0/1) 1  Attention: Serial 7 subtraction starting at 100 (0/3) 0  Language: Repeat phrase (0/2) 2  Language : Fluency (0/1) 0  Abstraction (0/2) 2  Delayed Recall (0/5) 5  Orientation (0/6) 6  Total 24  Adjusted Score (based on education) 24    CRANIAL NERVES: CN II: Visual fields are full to confrontation. Pupils are round equal and briskly reactive to light. CN III, IV, VI: extraocular movement are normal. No ptosis. CN V: Facial sensation is intact to light touch CN VII: Face is symmetric with normal eye closure  CN VIII: Hearing is normal to causal conversation. CN IX, X: Phonation is normal. CN XI: Head turning and shoulder shrug are intact  MOTOR: There is no pronator drift of out-stretched arms. Muscle bulk and tone are normal. Muscle strength is normal.  REFLEXES: Reflexes are 2+ and symmetric at the biceps, triceps, knees, and ankles. Plantar responses are flexor.  SENSORY: Intact to light touch, pinprick and vibratory sensation are intact in fingers and toes.  COORDINATION: There is no trunk or limb dysmetria noted.  GAIT/STANCE: Posture is normal. Gait is steady with normal steps, base, arm swing, and turning. Heel and toe walking are normal. Tandem gait is normal.  Romberg is absent.  REVIEW OF SYSTEMS:  Full 14 system review of systems performed and notable only for as above All other review of systems were negative.   ALLERGIES: Allergies  Allergen Reactions   Amoxicillin Nausea And Vomiting    HOME MEDICATIONS: Current Outpatient Medications  Medication Sig Dispense Refill   fish oil-omega-3 fatty acids 1000 MG capsule Take 1 g by mouth daily.     Magnesium 200 MG CHEW Chew by mouth.     melatonin 3 MG TABS tablet Take 3 mg by mouth at bedtime as needed.     Multiple Vitamins-Minerals  (PRESERVISION AREDS 2 PO) Take by mouth.     Probiotic Product (PROBIOTIC ADVANCED PO) Take by mouth.     simvastatin (ZOCOR) 20 MG tablet Take 20 mg by mouth at bedtime.     Ubiquinol 100 MG CAPS Take by mouth.     vitamin B-12 (CYANOCOBALAMIN) 1000 MCG tablet Take 1,000 mcg by mouth daily.     No current facility-administered medications for this visit.    PAST MEDICAL HISTORY: Past Medical History:  Diagnosis Date   Hypercholesteremia    Hypertension    Murmur, cardiac    Osteoporosis     PAST SURGICAL HISTORY: Past Surgical History:  Procedure Laterality Date   APPENDECTOMY     CESAREAN SECTION     ROTATOR CUFF REPAIR      FAMILY HISTORY: No family history on file.  SOCIAL HISTORY: Social History   Socioeconomic History   Marital status: Married    Spouse name: Not on file   Number of children: Not on file   Years of education: Not on file   Highest education level: Not on file  Occupational History   Not on file  Tobacco Use   Smoking status: Never   Smokeless tobacco: Not  on file  Substance and Sexual Activity   Alcohol use: Yes   Drug use: No   Sexual activity: Not on file  Other Topics Concern   Not on file  Social History Narrative   Not on file   Social Determinants of Health   Financial Resource Strain: Not on file  Food Insecurity: Not on file  Transportation Needs: Not on file  Physical Activity: Not on file  Stress: Not on file  Social Connections: Not on file  Intimate Partner Violence: Not on file      Marcial Pacas, M.D. Ph.D.  [facility] [address] [place], [place] [address] Ph: [phone] Fax: [phone]  CC:  Tamala Julian, MD [address] [place],  [place] [address]  Tamala Julian, MD

## 2022-06-24 ENCOUNTER — Encounter: Payer: Self-pay | Admitting: Neurology

## 2022-07-20 DIAGNOSIS — R351 Nocturia: Secondary | ICD-10-CM | POA: Diagnosis not present

## 2022-09-08 DIAGNOSIS — R4189 Other symptoms and signs involving cognitive functions and awareness: Secondary | ICD-10-CM | POA: Diagnosis not present

## 2022-09-08 DIAGNOSIS — R0981 Nasal congestion: Secondary | ICD-10-CM | POA: Diagnosis not present

## 2022-09-08 DIAGNOSIS — Z8616 Personal history of COVID-19: Secondary | ICD-10-CM | POA: Diagnosis not present

## 2022-09-08 DIAGNOSIS — H6123 Impacted cerumen, bilateral: Secondary | ICD-10-CM | POA: Diagnosis not present

## 2022-09-08 DIAGNOSIS — E7800 Pure hypercholesterolemia, unspecified: Secondary | ICD-10-CM | POA: Diagnosis not present

## 2022-09-08 DIAGNOSIS — R42 Dizziness and giddiness: Secondary | ICD-10-CM | POA: Diagnosis not present

## 2022-09-08 DIAGNOSIS — U099 Post covid-19 condition, unspecified: Secondary | ICD-10-CM | POA: Diagnosis not present

## 2022-09-14 ENCOUNTER — Telehealth: Payer: Self-pay | Admitting: Family Medicine

## 2022-09-14 NOTE — Telephone Encounter (Signed)
LVM to schedule new patient appt for her. Husband is existing patient of Dr. Nani Ravens. Dr. Nani Ravens approved being her PCP as well.

## 2022-09-15 DIAGNOSIS — Z20822 Contact with and (suspected) exposure to covid-19: Secondary | ICD-10-CM | POA: Diagnosis not present

## 2022-09-15 DIAGNOSIS — Z79899 Other long term (current) drug therapy: Secondary | ICD-10-CM | POA: Diagnosis not present

## 2022-09-15 DIAGNOSIS — Z1159 Encounter for screening for other viral diseases: Secondary | ICD-10-CM | POA: Diagnosis not present

## 2022-09-15 DIAGNOSIS — Z7185 Encounter for immunization safety counseling: Secondary | ICD-10-CM | POA: Diagnosis not present

## 2022-09-15 DIAGNOSIS — R42 Dizziness and giddiness: Secondary | ICD-10-CM | POA: Diagnosis not present

## 2022-09-15 DIAGNOSIS — H9193 Unspecified hearing loss, bilateral: Secondary | ICD-10-CM | POA: Diagnosis not present

## 2022-09-15 DIAGNOSIS — R0981 Nasal congestion: Secondary | ICD-10-CM | POA: Diagnosis not present

## 2022-09-15 DIAGNOSIS — Z1329 Encounter for screening for other suspected endocrine disorder: Secondary | ICD-10-CM | POA: Diagnosis not present

## 2022-09-15 DIAGNOSIS — E7800 Pure hypercholesterolemia, unspecified: Secondary | ICD-10-CM | POA: Diagnosis not present

## 2022-09-15 DIAGNOSIS — E559 Vitamin D deficiency, unspecified: Secondary | ICD-10-CM | POA: Diagnosis not present

## 2022-09-15 DIAGNOSIS — H6123 Impacted cerumen, bilateral: Secondary | ICD-10-CM | POA: Diagnosis not present

## 2022-09-15 DIAGNOSIS — U099 Post covid-19 condition, unspecified: Secondary | ICD-10-CM | POA: Diagnosis not present

## 2022-09-15 DIAGNOSIS — Z131 Encounter for screening for diabetes mellitus: Secondary | ICD-10-CM | POA: Diagnosis not present

## 2022-09-15 DIAGNOSIS — D539 Nutritional anemia, unspecified: Secondary | ICD-10-CM | POA: Diagnosis not present

## 2022-09-22 ENCOUNTER — Ambulatory Visit: Payer: Medicare HMO | Admitting: Family Medicine

## 2022-09-24 ENCOUNTER — Encounter: Payer: Self-pay | Admitting: Family Medicine

## 2022-09-24 ENCOUNTER — Ambulatory Visit (INDEPENDENT_AMBULATORY_CARE_PROVIDER_SITE_OTHER): Payer: Medicare HMO | Admitting: Family Medicine

## 2022-09-24 DIAGNOSIS — R351 Nocturia: Secondary | ICD-10-CM | POA: Diagnosis not present

## 2022-09-24 DIAGNOSIS — Z1231 Encounter for screening mammogram for malignant neoplasm of breast: Secondary | ICD-10-CM | POA: Diagnosis not present

## 2022-09-24 DIAGNOSIS — G4700 Insomnia, unspecified: Secondary | ICD-10-CM

## 2022-09-24 MED ORDER — DOXEPIN HCL 6 MG PO TABS
6.0000 mg | ORAL_TABLET | Freq: Every evening | ORAL | 2 refills | Status: DC | PRN
Start: 2022-09-24 — End: 2022-12-08

## 2022-09-24 NOTE — Patient Instructions (Addendum)
Take Metamucil or Benefiber daily.  Go ahead and schedule your flu shot.   Go and schedule your mammogram. Let us know if you need an order.   I would like to know the medicines you have been on sleep and urination that did not work well.   Aim to do some physical exertion for 150 minutes per week. This is typically divided into 5 days per week, 30 minutes per day. The activity should be enough to get your heart rate up. Anything is better than nothing if you have time constraints.   Sleep is important to [place] all. Getting good sleep is imperative to adequate functioning during the day. Work with our counselors who are trained to help people obtain quality sleep. Call [phone] to schedule an appointment or if you are curious about insurance coverage/cost.  Sleep Hygiene Tips: Do not watch TV or look at screens within 1 hour of going to bed. If you do, make sure there is a blue light filter (nighttime mode) involved. Try to go to bed around the same time every night. Wake up at the same time within 1 hour of regular time. Ex: If you wake up at 7 AM for work, do not sleep past 8 AM on days that you don't work. Do not drink alcohol before bedtime. Do not consume caffeine-containing beverages after noon or within 9 hours of intended bedtime. Get regular exercise/physical activity in your life, but not within 2 hours of planned bedtime. Do not take naps.  Do not eat within 2 hours of planned bedtime. Melatonin, 3-5 mg 30-60 minutes before planned bedtime may be helpful.  The bed should be for sleep or sex only. If after 20-30 minutes you are unable to fall asleep, get up and do something relaxing. Do this until you feel ready to go to sleep again.   Let us know if you need anything.

## 2022-09-24 NOTE — Progress Notes (Signed)
Chief Complaint  Patient presents with   New Patient (Initial Visit)       New Patient Visit SUBJECTIVE: HPI: Sherri Parks is an 76 y.o.female who is being seen for establishing care.  The patient was previously seen at Dr. Val Riles office.  She is here with her husband.  Nocturia-patient will urinate around 3 times per night.  She is reportedly been on several medications for this including DDAVP.  She was following with urology who thought is more in sleep related problem rather than neurologic.  She had several voiding studies done that were unremarkable.  She is not retaining urine.  She denies any bleeding, fevers, constipation, or pain.  For several decades, the patient has been having trouble sleeping.  She tends to worry about her children and grandchildren.  This leads to racing thoughts and difficulty sleeping.  She does not always feel well rested.  She has been on several medications in the past but stopped due to concern for dementia.  Other medications she has been on made her very drowsy.  She thinks she has been on trazodone and may be Ambien.  She does not remember the list of other medications.  She is not following with a counselor or therapist at this time.  She is not taking anything for anxiety.  Past Medical History:  Diagnosis Date   Hypercholesteremia    Hypertension    Murmur, cardiac    Osteoporosis    Past Surgical History:  Procedure Laterality Date   APPENDECTOMY     CESAREAN SECTION     ROTATOR CUFF REPAIR     History reviewed. No pertinent family history. Allergies  Allergen Reactions   Amoxicillin Nausea And Vomiting    Current Outpatient Medications:    Doxepin HCl 6 MG TABS, Take 1 tablet (6 mg total) by mouth at bedtime as needed (Sleep)., Disp: 30 tablet, Rfl: 2   fish oil-omega-3 fatty acids 1000 MG capsule, Take 1 g by mouth daily., Disp: , Rfl:    Magnesium 200 MG CHEW, Chew by mouth., Disp: , Rfl:    melatonin 3 MG TABS tablet, Take 3 mg  by mouth at bedtime as needed., Disp: , Rfl:    Multiple Vitamins-Minerals (PRESERVISION AREDS 2 PO), Take by mouth., Disp: , Rfl:    Probiotic Product (PROBIOTIC ADVANCED PO), Take by mouth., Disp: , Rfl:    simvastatin (ZOCOR) 20 MG tablet, Take 20 mg by mouth at bedtime., Disp: , Rfl:    Ubiquinol 100 MG CAPS, Take by mouth., Disp: , Rfl:    vitamin B-12 (CYANOCOBALAMIN) 1000 MCG tablet, Take 1,000 mcg by mouth daily., Disp: , Rfl:   OBJECTIVE: BP 120/82 (BP Location: Left Arm, Patient Position: Sitting, Cuff Size: Normal)   Pulse (!) 101   Temp 98.4 F (36.9 C) (Oral)   Ht '4\' 11"'$  (1.499 m)   Wt 85 lb 6 oz (38.7 kg)   SpO2 96%   BMI 17.24 kg/m  General:  well developed, well nourished, in no apparent distress Lungs:  clear to auscultation, breath sounds equal bilaterally, no respiratory distress Cardio:  regular rate and rhythm, no LE edema or bruits Abdomen: Bowel sounds present, soft, nontender, nondistended, no masses or organomegaly Neuro:  gait normal Psych: well oriented with normal range of affect and appropriate judgment/insight  ASSESSMENT/PLAN: Insomnia, unspecified type  Encounter for screening mammogram for malignant neoplasm of breast  Nocturia  Chronic, unstable.  Start doxepin 6 mg nightly as needed.  Sleep hygiene  discussed and written down.  Therapy information written down in paperwork.  Counseled on exercise.  Follow-up in 1 month to recheck this.. We discussed breast cancer screening guidelines for women her age and how she is in a bit of a gray zone.  She would like to continue getting 3D mammograms done through [facility].  She will call to schedule and let me know if she needs an order. Chronic, unstable.  Could be related to sleep, could consider Myrbetriq or Ditropan depending on what she has been on in the past.  We will try 1 thing at a time though and treat #1 first. The patient and her spouse voiced understanding and agreement to the  plan.   [place], DO 09/24/22  3:00 PM

## 2022-10-01 DIAGNOSIS — I2510 Atherosclerotic heart disease of native coronary artery without angina pectoris: Secondary | ICD-10-CM | POA: Diagnosis not present

## 2022-10-01 DIAGNOSIS — G629 Polyneuropathy, unspecified: Secondary | ICD-10-CM | POA: Diagnosis not present

## 2022-10-01 DIAGNOSIS — R32 Unspecified urinary incontinence: Secondary | ICD-10-CM | POA: Diagnosis not present

## 2022-10-01 DIAGNOSIS — Z818 Family history of other mental and behavioral disorders: Secondary | ICD-10-CM | POA: Diagnosis not present

## 2022-10-01 DIAGNOSIS — E785 Hyperlipidemia, unspecified: Secondary | ICD-10-CM | POA: Diagnosis not present

## 2022-10-01 DIAGNOSIS — M1990 Unspecified osteoarthritis, unspecified site: Secondary | ICD-10-CM | POA: Diagnosis not present

## 2022-10-01 DIAGNOSIS — R636 Underweight: Secondary | ICD-10-CM | POA: Diagnosis not present

## 2022-10-01 DIAGNOSIS — G4700 Insomnia, unspecified: Secondary | ICD-10-CM | POA: Diagnosis not present

## 2022-10-01 DIAGNOSIS — Z681 Body mass index (BMI) 19 or less, adult: Secondary | ICD-10-CM | POA: Diagnosis not present

## 2022-10-01 DIAGNOSIS — I739 Peripheral vascular disease, unspecified: Secondary | ICD-10-CM | POA: Diagnosis not present

## 2022-10-01 DIAGNOSIS — Z803 Family history of malignant neoplasm of breast: Secondary | ICD-10-CM | POA: Diagnosis not present

## 2022-10-01 DIAGNOSIS — R69 Illness, unspecified: Secondary | ICD-10-CM | POA: Diagnosis not present

## 2022-10-25 ENCOUNTER — Encounter: Payer: Self-pay | Admitting: Family Medicine

## 2022-10-25 ENCOUNTER — Ambulatory Visit (INDEPENDENT_AMBULATORY_CARE_PROVIDER_SITE_OTHER): Payer: Medicare HMO | Admitting: Family Medicine

## 2022-10-25 VITALS — BP 132/80 | HR 111 | Temp 98.2°F | Ht 60.0 in | Wt 87.4 lb

## 2022-10-25 DIAGNOSIS — M546 Pain in thoracic spine: Secondary | ICD-10-CM | POA: Diagnosis not present

## 2022-10-25 DIAGNOSIS — Z23 Encounter for immunization: Secondary | ICD-10-CM | POA: Diagnosis not present

## 2022-10-25 DIAGNOSIS — G8929 Other chronic pain: Secondary | ICD-10-CM | POA: Diagnosis not present

## 2022-10-25 DIAGNOSIS — G4700 Insomnia, unspecified: Secondary | ICD-10-CM

## 2022-10-25 MED ORDER — DOXEPIN HCL 10 MG PO CAPS: 10.0000 mg | ORAL_CAPSULE | Freq: Every day | ORAL | 1 refills | Status: DC

## 2022-10-25 NOTE — Patient Instructions (Addendum)
The Shingrix vaccine (for shingles) is a 2 shot series spaced 2-6 months apart. It can make people feel low energy, achy and almost like they have the flu for 48 hours after injection. 1/5 people can have nausea and/or vomiting. Please plan accordingly when deciding on when to get this shot. Call our office for a nurse visit appointment to get this. The second shot of the series is less severe regarding the side effects, but it still lasts 48 hours.   Please consider going to the pharmacy for the tetanus booster shot.   Stay active.   Let us know if you need anything.  Mid-Back Strain Rehab It is normal to feel mild stretching, pulling, tightness, or discomfort as you do these exercises, but you should stop right away if you feel sudden pain or your pain gets worse.  Stretching and range of motion exercises This exercise warms up your muscles and joints and improves the movement and flexibility of your back and shoulders. This exercise also help to relieve pain. Exercise A: Chest and spine stretch  Lie down on your back on a firm surface. Roll a towel or a small blanket so it is about 4 inches (10 cm) in diameter. Put the towel lengthwise under the middle of your back so it is under your spine, but not under your shoulder blades. To increase the stretch, you may put your hands behind your head and let your elbows fall to your sides. Hold for 30 seconds. Repeat exercise 2 times. Complete this exercise 3 times per week. Strengthening exercises These exercises build strength and endurance in your back and your shoulder blade muscles. Endurance is the ability to use your muscles for a long time, even after they get tired. Exercise C: Straight arm rows (shoulder extension) Stand with your feet shoulder width apart. Secure an exercise band to a stable object in front of you so the band is at or above shoulder height. Hold one end of the exercise band in each hand. Straighten your elbows and lift  your hands up to shoulder height. Step back, away from the secured end of the exercise band, until the band stretches. Squeeze your shoulder blades together and pull your hands down to the sides of your thighs. Stop when your hands are straight down by your sides. Do not let your hands go behind your body. Hold for 2 seconds. Slowly return to the starting position. Repeat 2 times. Complete this exercise 3 times per week. Exercise D: Shoulder external rotation, prone Lie on your abdomen on a firm bed so your left / right forearm hangs over the edge of the bed and your upper arm is on the bed, straight out from your body. Your elbow should be bent. Your palm should be facing your feet. If instructed, hold a 2-5 lb weight in your hand. Squeeze your shoulder blade toward the middle of your back. Do not let your shoulder lift toward your ear. Keep your elbow bent in an "L" shape (90 degrees) while you slowly move your forearm up toward the ceiling. Move your forearm up to the height of the bed, toward your head. Your upper arm should not move. At the top of the movement, your palm should face the floor. Hold for 1 second. Slowly return to the starting position and relax your muscles. Repeat 2 times. Complete this exercise 3 times per week. Exercise E: Scapular retraction and external rotation, rowing  Sit in a stable chair without armrests, or  stand. Secure an exercise band to a stable object in front of you so it is at shoulder height. Hold one end of the exercise band in each hand. Bring your arms out straight in front of you. Step back, away from the secured end of the exercise band, until the band stretches. Pull the band backward. As you do this, bend your elbows and squeeze your shoulder blades together, but avoid letting the rest of your body move. Do not let your shoulders lift up toward your ears. Stop when your elbows are at your sides or slightly behind your body. Hold for 1  second1. Slowly straighten your arms to return to the starting position. Repeat 2 times. Complete this exercise 3 times per week. Posture and body mechanics  Body mechanics refers to the movements and positions of your body while you do your daily activities. Posture is part of body mechanics. Good posture and healthy body mechanics can help to relieve stress in your body's tissues and joints. Good posture means that your spine is in its natural S-curve position (your spine is neutral), your shoulders are pulled back slightly, and your head is not tipped forward. The following are general guidelines for applying improved posture and body mechanics to your everyday activities. Standing  When standing, keep your spine neutral and your feet about hip-width apart. Keep a slight bend in your knees. Your ears, shoulders, and hips should line up. When you do a task in which you lean forward while standing in one place for a long time, place one foot up on a stable object that is 2-4 inches (5-10 cm) high, such as a footstool. This helps keep your spine neutral. Sitting  When sitting, keep your spine neutral and keep your feet flat on the floor. Use a footrest, if necessary, and keep your thighs parallel to the floor. Avoid rounding your shoulders, and avoid tilting your head forward. When working at a desk or a computer, keep your desk at a height where your hands are slightly lower than your elbows. Slide your chair under your desk so you are close enough to maintain good posture. When working at a computer, place your monitor at a height where you are looking straight ahead and you do not have to tilt your head forward or downward to look at the screen. Resting  When lying down and resting, avoid positions that are most painful for you. If you have pain with activities such as sitting, bending, stooping, or squatting (flexion-based activities), lie in a position in which your body does not bend very  much. For example, avoid curling up on your side with your arms and knees near your chest (fetal position). If you have pain with activities such as standing for a long time or reaching with your arms (extension-based activities), lie with your spine in a neutral position and bend your knees slightly. Try the following positions: Lying on your side with a pillow between your knees. Lying on your back with a pillow under your knees.  Lifting  When lifting objects, keep your feet at least shoulder-width apart and tighten your abdominal muscles. Bend your knees and hips and keep your spine neutral. It is important to lift using the strength of your legs, not your back. Do not lock your knees straight out. Always ask for help to lift heavy or awkward objects. Make sure you discuss any questions you have with your health care provider. Document Released: 11/29/2005 Document Revised: 08/05/2016 Document Reviewed:  09/10/2015 Elsevier Interactive Patient Education  [employer].

## 2022-10-25 NOTE — Progress Notes (Signed)
Chief Complaint  Patient presents with   Follow-up    Subjective: Patient is a 76 y.o. female here for f/u insomnia. Here w spouse who helps w hx.   Started on doxepin 6 mg qhs. Reports compliance and no AE's. She is sleeping better, but still waking up a lot at night. She has failed Seroquel and trazodone 2/2 AE's.   She has pain over her left upper back region.  No injury or change in activity.  No rashes, bruising, redness, or swelling.  She works with therapy at her current assisted living facility but she is not sure if they know about this issue.  Past Medical History:  Diagnosis Date   Hypercholesteremia    Hypertension    Murmur, cardiac    Osteoporosis     Objective: BP 132/80 (BP Location: Left Arm, Patient Position: Sitting, Cuff Size: Normal)   Pulse (!) 111   Temp 98.2 F (36.8 C) (Oral)   Ht 5' (1.524 m)   Wt 87 lb 6 oz (39.6 kg)   SpO2 94%   BMI 17.06 kg/m  General: Awake, appears stated age MSK: TTP over the lateral lower thoracic spine musculature, no bony tenderness Lungs: CTAB, no rales, wheezes or rhonchi. No accessory muscle use Psych: Normal affect and mood  Assessment and Plan: Insomnia, unspecified type - Plan: doxepin (SINEQUAN) 10 MG capsule  Chronic left-sided thoracic back pain  Chronic, unstable. Increase doxepin from 6 mg/d to 10 mg/d. Stay active. F/u in 1 mo. Stretches and exercises provided, heat, ice, Tylenol.  If no improvement, will consider sports medicine versus physical therapy. Shingrix and Tdap rec'd. PCV20 today.  The patient voiced understanding and agreement to the plan.  [place], DO 10/25/22  11:54 AM

## 2022-10-26 DIAGNOSIS — Z1231 Encounter for screening mammogram for malignant neoplasm of breast: Secondary | ICD-10-CM | POA: Diagnosis not present

## 2022-11-01 ENCOUNTER — Ambulatory Visit: Payer: Medicare HMO | Admitting: Neurology

## 2022-11-11 DIAGNOSIS — M79642 Pain in left hand: Secondary | ICD-10-CM | POA: Diagnosis not present

## 2022-11-11 DIAGNOSIS — M1812 Unilateral primary osteoarthritis of first carpometacarpal joint, left hand: Secondary | ICD-10-CM | POA: Diagnosis not present

## 2022-12-08 ENCOUNTER — Ambulatory Visit (INDEPENDENT_AMBULATORY_CARE_PROVIDER_SITE_OTHER): Payer: Medicare HMO | Admitting: Family Medicine

## 2022-12-08 ENCOUNTER — Encounter: Payer: Self-pay | Admitting: Family Medicine

## 2022-12-08 VITALS — BP 139/66 | HR 80 | Temp 98.0°F | Resp 16 | Wt 90.8 lb

## 2022-12-08 DIAGNOSIS — M810 Age-related osteoporosis without current pathological fracture: Secondary | ICD-10-CM | POA: Diagnosis not present

## 2022-12-08 DIAGNOSIS — G4700 Insomnia, unspecified: Secondary | ICD-10-CM

## 2022-12-08 LAB — COMPREHENSIVE METABOLIC PANEL
ALT: 13 U/L (ref 0–35)
AST: 22 U/L (ref 0–37)
Albumin: 4.1 g/dL (ref 3.5–5.2)
Alkaline Phosphatase: 65 U/L (ref 39–117)
BUN: 24 mg/dL — ABNORMAL HIGH (ref 6–23)
CO2: 32 mEq/L (ref 19–32)
Calcium: 9.7 mg/dL (ref 8.4–10.5)
Chloride: 103 mEq/L (ref 96–112)
Creatinine, Ser: 0.75 mg/dL (ref 0.40–1.20)
GFR: 77.01 mL/min (ref >60.00)
Glucose, Bld: 86 mg/dL (ref 70–99)
Potassium: 4.9 mEq/L (ref 3.5–5.1)
Sodium: 140 mEq/L (ref 135–145)
Total Bilirubin: 0.4 mg/dL (ref 0.2–1.2)
Total Protein: 6.9 g/dL (ref 6.0–8.3)

## 2022-12-08 LAB — VITAMIN D 25 HYDROXY (VIT D DEFICIENCY, FRACTURES): VITD: 60.21 ng/mL (ref 30.00–100.00)

## 2022-12-08 MED ORDER — DOXEPIN HCL 10 MG PO CAPS: 10.0000 mg | ORAL_CAPSULE | Freq: Every day | ORAL | 3 refills | Status: DC

## 2022-12-08 NOTE — Progress Notes (Signed)
Chief Complaint  Patient presents with   Insomnia    Here for follow up, "doing better"    Subjective: Patient is a 76 y.o. female here for fu. Here w spouse.   Treated with doxepin 6 mg daily for sleep.  We increased to 10 mg daily at her last visit and she reports 80% improvement.  She reports compliance and no adverse effects.  She gets around 7 to 8 hours of sleep nightly.  Past Medical History:  Diagnosis Date   Hypercholesteremia    Hypertension    Murmur, cardiac    Osteoporosis     Objective: BP 139/66 (BP Location: Left Arm, Patient Position: Sitting, Cuff Size: Small)   Pulse 80   Temp 98 F (36.7 C) (Oral)   Resp 16   Wt 90 lb 12.8 oz (41.2 kg)   SpO2 100%   BMI 17.73 kg/m  General: Awake, appears stated age Heart: RRR, no LE edema Lungs: CTAB, no rales, wheezes or rhonchi. No accessory muscle use Psych: Normal affect and mood  Assessment and Plan: Insomnia, unspecified type - Plan: doxepin (SINEQUAN) 10 MG capsule  Age-related osteoporosis without current pathological fracture - Plan: Comprehensive metabolic panel, VITAMIN D 25 Hydroxy (Vit-D Deficiency, Fractures)  Chronic, stable.  Continue doxepin 10 mg nightly. Check labs as above.  Last vitamin D check was a year ago. Follow-up in 6 months for a physical or as needed. The patient and her spouse voiced understanding and agreement to the plan.  [place], DO 12/08/22  11:21 AM

## 2022-12-08 NOTE — Patient Instructions (Signed)
Give [place] 2-3 business days to get the results of your labs back.   Take 1200 mg of calcium daily and at least 1000 units of vitamin D3 daily.   Let us know if you need anything.

## 2022-12-09 ENCOUNTER — Telehealth: Payer: Self-pay

## 2022-12-09 NOTE — Telephone Encounter (Signed)
Pt called back regarding labs- verified that it is vit D3 1000units daily

## 2022-12-14 DIAGNOSIS — H35363 Drusen (degenerative) of macula, bilateral: Secondary | ICD-10-CM | POA: Diagnosis not present

## 2022-12-14 DIAGNOSIS — H52203 Unspecified astigmatism, bilateral: Secondary | ICD-10-CM | POA: Diagnosis not present

## 2022-12-14 DIAGNOSIS — H5202 Hypermetropia, left eye: Secondary | ICD-10-CM | POA: Diagnosis not present

## 2022-12-14 DIAGNOSIS — H02834 Dermatochalasis of left upper eyelid: Secondary | ICD-10-CM | POA: Diagnosis not present

## 2022-12-14 DIAGNOSIS — Z961 Presence of intraocular lens: Secondary | ICD-10-CM | POA: Diagnosis not present

## 2022-12-14 DIAGNOSIS — H04123 Dry eye syndrome of bilateral lacrimal glands: Secondary | ICD-10-CM | POA: Diagnosis not present

## 2022-12-14 DIAGNOSIS — H02831 Dermatochalasis of right upper eyelid: Secondary | ICD-10-CM | POA: Diagnosis not present

## 2022-12-14 DIAGNOSIS — H35372 Puckering of macula, left eye: Secondary | ICD-10-CM | POA: Diagnosis not present

## 2022-12-14 DIAGNOSIS — H353132 Nonexudative age-related macular degeneration, bilateral, intermediate dry stage: Secondary | ICD-10-CM | POA: Diagnosis not present

## 2022-12-14 DIAGNOSIS — H524 Presbyopia: Secondary | ICD-10-CM | POA: Diagnosis not present

## 2022-12-14 DIAGNOSIS — H43813 Vitreous degeneration, bilateral: Secondary | ICD-10-CM | POA: Diagnosis not present

## 2022-12-14 DIAGNOSIS — H26491 Other secondary cataract, right eye: Secondary | ICD-10-CM | POA: Diagnosis not present

## 2022-12-21 DIAGNOSIS — H26491 Other secondary cataract, right eye: Secondary | ICD-10-CM | POA: Diagnosis not present

## 2022-12-28 DIAGNOSIS — R69 Illness, unspecified: Secondary | ICD-10-CM | POA: Diagnosis not present

## 2023-01-07 DIAGNOSIS — R69 Illness, unspecified: Secondary | ICD-10-CM | POA: Diagnosis not present

## 2023-01-18 ENCOUNTER — Ambulatory Visit: Payer: Medicare HMO | Admitting: Neurology

## 2023-01-18 ENCOUNTER — Telehealth: Payer: Self-pay | Admitting: Neurology

## 2023-01-18 ENCOUNTER — Encounter: Payer: Self-pay | Admitting: Neurology

## 2023-01-18 VITALS — BP 119/85 | HR 86 | Ht 59.0 in | Wt 90.0 lb

## 2023-01-18 DIAGNOSIS — R413 Other amnesia: Secondary | ICD-10-CM | POA: Diagnosis not present

## 2023-01-18 DIAGNOSIS — F4323 Adjustment disorder with mixed anxiety and depressed mood: Secondary | ICD-10-CM

## 2023-01-18 DIAGNOSIS — F419 Anxiety disorder, unspecified: Secondary | ICD-10-CM | POA: Diagnosis not present

## 2023-01-18 DIAGNOSIS — R69 Illness, unspecified: Secondary | ICD-10-CM | POA: Diagnosis not present

## 2023-01-18 NOTE — Addendum Note (Signed)
Addended by: Suzzanne Cloud on: 01/18/2023 09:40 PM   Modules accepted: Orders

## 2023-01-18 NOTE — Telephone Encounter (Signed)
Referral for Occupational Therapy sent through EPIC to [facility]. [phone]

## 2023-01-18 NOTE — Telephone Encounter (Signed)
Referral for psychiatry fax to [facility]. Phone: [phone], Fax: [phone]

## 2023-01-18 NOTE — Patient Instructions (Addendum)
I will place a referral for psychiatry  Continue physical exercise, increase your brain stimulating activity  I have concerns about your driving, recommend close supervision

## 2023-01-18 NOTE — Progress Notes (Addendum)
Chief Complaint  Patient presents with   Follow-up    Rm 12. Alone. She reports difficulty remembering names.   ASSESSMENT AND PLAN  Sherri Parks is a 77 y.o. 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 involving left occipital and left frontal region in October 2022  Family history of dementia  Most consistent with central nervous system degenerative disorder  Laboratory evaluation showed no treatable etiology  Sleeping better on Doxepin  Referral to psychiatry for mood management, is crying today Consider memory medications, she is not interested currently We discussed my concerns about her driving, would recommend close supervision, allowing her husband to drive, I discussed this with her husband, I am going to order OT driving evaluation, would be best to refrain from driving at this point Follow up in 6 months    Orders Placed This Encounter  Procedures   Ambulatory referral to Psychiatry   Ambulatory referral to Occupational Therapy   DIAGNOSTIC DATA (LABS, IMAGING, TESTING) - I reviewed patient records, labs, notes, testing and imaging myself where available.   MEDICAL HISTORY:  Sherri Parks, is a 77 year old female, accompanied by her husband, seen in request by her primary care physician Dr. [place] Desanctis, Gwynn Burly, for evaluation of memory loss, initial evaluation was on Apr 29, 2022  I reviewed and summarized the referring note. PMHx. 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, very organized, exercise regularly, she does have family history of memory loss, mother in her  late 17s, and sister in her 15s suffered Alzheimer's disease  She noted gradual onset of memory loss over the past couple years, most noticeable since she recovering from her COVID infection in September 2022, she complains of word finding difficulties, forget conversation, repeating herself, also have depression, crying easily, poor sleep quality, woke up 3-4 times to use the bathroom,  Husband reported that she continues to be busy, taking care of her household chores, noted mild hearing loss  In October 2022, followed complaining of frequent headaches, MRI at [place] on September 16, 2021 showed early subacute hemorrhage in the left occipital lobe, measuring up to 10 mm, in addition, there is 9 mm area of late subacute hemorrhage in the left frontal lobe, diffuse foci of susceptibility, consistent with additional more remote hemorrhages, 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  UPDATE June 23 2022: Patient is tearful during today's visit, continue complains of anxiety difficulty sleeping, very frustrated about her memory loss  Personally reviewed MRI of the brain without contrast June 2023, hemorrhagic product in the right parietal occipital, left frontal cortical region, cerebral atrophy, extensive small vessel disease, 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 Still trouble remembering names, worries about everything and everyone. Cries a lot. Seroquel was too strong. Is now taking Doxepin with good benefit. Her husband has PD. Goes  to the gym silver sneakers. Can't do shag dancing anymore, can't move that way anymore. She is tearful today about her  memory. Can't find the bills she has paid, brings her calendar books, fumbles with it today for reference   PHYSICAL EXAM:   Vitals:   01/18/23 1341  BP: 119/85  Pulse: 86  Weight: 90 lb (40.8 kg)  Height: '4\' 11"'$  (1.499 m)    Not recorded     Body mass index is 18.18 kg/m.  PHYSICAL EXAMNIATION:  Gen: NAD, conversant, well nourised, well groomed, is tearful today                      Cardiovascular: Regular rate rhythm, no peripheral edema, warm, nontender.  NEUROLOGICAL EXAM:  MENTAL STATUS: Speech/cognition: Awake, alert, oriented to history taking and casual conversation     01/18/2023    1:45 PM 04/29/2022    3:36 PM  Montreal Cognitive Assessment   Visuospatial/ Executive (0/5) 3 4  Naming (0/3) 2 2  Attention: Read list of digits (0/2) 2 2  Attention: Read list of letters (0/1) 0 1  Attention: Serial 7 subtraction starting at 100 (0/3) 3 0  Language: Repeat phrase (0/2) 2 2  Language : Fluency (0/1) 0 0  Abstraction (0/2) 1 2  Delayed Recall (0/5) 3 5  Orientation (0/6) 6 6  Total 22 24  Adjusted Score (based on education) 22 24    CRANIAL NERVES: CN II: Visual fields are full to confrontation. Pupils are round equal and briskly reactive to light. CN III, IV, VI: extraocular movement are normal. No ptosis. CN V: Facial sensation is intact to light touch CN VII: Face is symmetric with normal eye closure  CN VIII: Hearing is normal to causal conversation. CN IX, X: Phonation is normal. CN XI: Head turning and shoulder shrug are intact  MOTOR: Muscle strength is normal  REFLEXES: Reflexes are 2+ and symmetric at the biceps, triceps, knees  SENSORY: Intact to light touch COORDINATION: Finger nose finger and heel to shin was normal bilaterally   GAIT/STANCE: Gait is normal.   REVIEW OF SYSTEMS:  Full 14 system review of systems performed and notable only for as above All other review of systems were negative.   ALLERGIES: Allergies  Allergen  Reactions   Amoxicillin Nausea And Vomiting    HOME MEDICATIONS: Current Outpatient Medications  Medication Sig Dispense Refill   Cholecalciferol (D3 PO) Take 1 tablet by mouth daily.     doxepin (SINEQUAN) 10 MG capsule Take 1 capsule (10 mg total) by mouth at bedtime. 90 capsule 3   fish oil-omega-3 fatty acids 1000 MG capsule Take 1 g by mouth daily.     Magnesium 200 MG CHEW Chew by mouth.     Multiple Vitamins-Minerals (PRESERVISION AREDS PO) Take 1 tablet by mouth daily.     Probiotic Product (PROBIOTIC ADVANCED PO) Take by mouth.     simvastatin (ZOCOR) 20 MG tablet Take 20 mg by mouth at bedtime.     Ubiquinol 100 MG CAPS Take by mouth.     UNABLE TO FIND Take 1 tablet by mouth daily. Med Name: viactive calcium     vitamin B-12 (CYANOCOBALAMIN) 1000 MCG tablet Take 1,000 mcg by mouth daily.     No current facility-administered medications for this visit.    PAST MEDICAL HISTORY: Past Medical History:  Diagnosis Date   Hypercholesteremia    Hypertension    Murmur, cardiac    Osteoporosis  PAST SURGICAL HISTORY: Past Surgical History:  Procedure Laterality Date   APPENDECTOMY     CESAREAN SECTION     ROTATOR CUFF REPAIR      FAMILY HISTORY: History reviewed. No pertinent family history.  SOCIAL HISTORY: Social History   Socioeconomic History   Marital status: Married    Spouse name: Not on file   Number of children: Not on file   Years of education: Not on file   Highest education level: Not on file  Occupational History   Not on file  Tobacco Use   Smoking status: Never   Smokeless tobacco: Not on file  Substance and Sexual Activity   Alcohol use: Yes   Drug use: No   Sexual activity: Not on file  Other Topics Concern   Not on file  Social History Narrative   Not on file   Social Determinants of Health   Financial Resource Strain: Not on file  Food Insecurity: Not on file  Transportation Needs: Not on file  Physical Activity: Not on file   Stress: Not on file  Social Connections: Not on file  Intimate Partner Violence: Not on file   [place], Laqueta Jean, [facility] [address], [place], [place] [address] [phone]

## 2023-01-19 NOTE — Telephone Encounter (Signed)
Referral for Occupational Therapy resent to [facility]. Phone: [phone], Fax: [phone]. Due to had faxed to prior rehabilitation does not do driving evaluations.

## 2023-01-24 NOTE — Telephone Encounter (Signed)
They sent a fax on 2/7 to let us know they left a voice mail for the patient to call them back to schedule.

## 2023-02-03 ENCOUNTER — Telehealth: Payer: Self-pay | Admitting: Neurology

## 2023-02-03 NOTE — Telephone Encounter (Signed)
Pt's husband called needing to speak to the NP. He did not want to give much information only that he wants a call back to discuss his wifes driving and health. Husband was quite rude on call. Please advise.

## 2023-02-03 NOTE — Telephone Encounter (Signed)
Called to check in with patient, husband answered the phone and was extremely disrespectful and refused to talk to me and was telling me I would not understand their problems and refused to give any insight on what was going on other than it was related to the driving concerns. I requested to speak to the patient who also was very rude and told me I was not high enough at the office to assist their needs. I informed them I was trying to get more information because sarah had tried to reach out with no luck and they still refused and called me "unexceptable". Advised them that Judson Roch would not be back in office until Tuesday and they stated they would call back then and hung up. Will route to sarah as an [place]

## 2023-02-03 NOTE — Telephone Encounter (Addendum)
I called, spoke with her husband. They have decided not to pursue OT driving evaluation. They have been getting a lot of calls and have felt overwhelmed. I have concerns about her independent driving, recommend her husband do the driving. Out of caution, I have recommended it would be best to refrain from her driving at this point.

## 2023-03-16 ENCOUNTER — Telehealth: Payer: Self-pay | Admitting: Family Medicine

## 2023-03-16 NOTE — Telephone Encounter (Signed)
Contacted Andrienne Groller to schedule their annual wellness visit. Appointment made for 03/25/2023.  Sherol Dade; Care Guide [facility] Direct Dial: [phone]

## 2023-03-16 NOTE — Telephone Encounter (Signed)
Copied from [place] [id number]. Topic: Medicare AWV >> Mar 16, 2023 10:08 AM [name] wrote: Reason for CRM: Called patient to schedule Medicare Annual Wellness Visit (AWV). Left message for patient to call back and schedule Medicare Annual Wellness Visit (AWV).  Last date of AWV: 06/13/2020  Please schedule an appointment at any time with [name], [place]  .  If any questions, please contact me.  Thank you ,  Sherol Dade; [place] Direct Dial: [phone]

## 2023-03-21 DIAGNOSIS — L821 Other seborrheic keratosis: Secondary | ICD-10-CM | POA: Diagnosis not present

## 2023-03-21 DIAGNOSIS — Z129 Encounter for screening for malignant neoplasm, site unspecified: Secondary | ICD-10-CM | POA: Diagnosis not present

## 2023-03-21 DIAGNOSIS — Z85828 Personal history of other malignant neoplasm of skin: Secondary | ICD-10-CM | POA: Diagnosis not present

## 2023-03-25 ENCOUNTER — Ambulatory Visit (INDEPENDENT_AMBULATORY_CARE_PROVIDER_SITE_OTHER): Payer: Medicare HMO | Admitting: *Deleted

## 2023-03-25 VITALS — Ht 59.0 in | Wt 90.0 lb

## 2023-03-25 DIAGNOSIS — Z0000 Encounter for general adult medical examination without abnormal findings: Secondary | ICD-10-CM | POA: Diagnosis not present

## 2023-03-25 NOTE — Progress Notes (Signed)
Subjective:   Sherri Parks is a 77 y.o. female who presents for Medicare Annual (Subsequent) preventive examination.  I connected with  Sherri Parks on 03/25/23 by a audio enabled telemedicine application and verified that I am speaking with the correct person using two identifiers.  Patient Location: Home  Provider Location: Office/Clinic  I discussed the limitations of evaluation and management by telemedicine. The patient expressed understanding and agreed to proceed.   Review of Systems     Cardiac Risk Factors include: advanced age (>75men, >35 women);dyslipidemia     Objective:    Today's Vitals   03/25/23 1020  Weight: 90 lb (40.8 kg)  Height: 4\' 11"  (1.499 m)   Body mass index is 18.18 kg/m.     03/25/2023   10:24 AM 09/07/2021   10:18 AM  Advanced Directives  Does Patient Have a Medical Advance Directive? Yes No  Type of Estate agent of [place];Living will   Copy of Healthcare Power of Attorney in Chart? No - copy requested     Current Medications (verified) Outpatient Encounter Medications as of 03/25/2023  Medication Sig   Cholecalciferol (D3 PO) Take 1 tablet by mouth daily.   doxepin (SINEQUAN) 10 MG capsule Take 1 capsule (10 mg total) by mouth at bedtime.   fish oil-omega-3 fatty acids 1000 MG capsule Take 1 g by mouth daily.   Magnesium 200 MG CHEW Chew by mouth.   Multiple Vitamins-Minerals (PRESERVISION AREDS PO) Take 1 tablet by mouth daily.   Probiotic Product (PROBIOTIC ADVANCED PO) Take by mouth.   simvastatin (ZOCOR) 20 MG tablet Take 20 mg by mouth at bedtime.   Ubiquinol 100 MG CAPS Take by mouth.   UNABLE TO FIND Take 1 tablet by mouth daily. Med Name: viactive calcium   vitamin B-12 (CYANOCOBALAMIN) 1000 MCG tablet Take 1,000 mcg by mouth daily.   No facility-administered encounter medications on file as of 03/25/2023.    Allergies (verified) Amoxicillin   History: Past Medical History:  Diagnosis Date    Hypercholesteremia    Hypertension    Murmur, cardiac    Osteoporosis    Past Surgical History:  Procedure Laterality Date   APPENDECTOMY     CESAREAN SECTION     ROTATOR CUFF REPAIR     History reviewed. No pertinent family history. Social History   Socioeconomic History   Marital status: Married    Spouse name: Not on file   Number of children: Not on file   Years of education: Not on file   Highest education level: Not on file  Occupational History   Not on file  Tobacco Use   Smoking status: Never   Smokeless tobacco: Not on file  Substance and Sexual Activity   Alcohol use: Yes   Drug use: No   Sexual activity: Not on file  Other Topics Concern   Not on file  Social History Narrative   Not on file   Social Determinants of Parks   Financial Resource Strain: Low Risk  (03/25/2023)   Overall Financial Resource Strain (CARDIA)    Difficulty of Paying Living Expenses: Not hard at all  Food Insecurity: No Food Insecurity (03/25/2023)   Hunger Vital Sign    Worried About Running Out of Food in the Last Year: Never true    Ran Out of Food in the Last Year: Never true  Transportation Needs: No Transportation Needs (03/25/2023)   PRAPARE - Administrator, Civil Service (Medical): No  Lack of Transportation (Non-Medical): No  Physical Activity: Inactive (03/25/2023)   Exercise Vital Sign    Days of Exercise per Week: 0 days    Minutes of Exercise per Session: 0 min  Stress: Stress Concern Present (03/25/2023)   Sherri Parks - Occupational Stress Questionnaire    Feeling of Stress : To some extent  Social Connections: Socially Isolated (03/25/2023)   Social Connection and Isolation Panel [NHANES]    Frequency of Communication with Friends and Family: Once a week    Frequency of Social Gatherings with Friends and Family: Never    Attends Religious Services: Never    Diplomatic Services operational officer: No    Attends Museum/gallery exhibitions officer: Never    Marital Status: Married    Tobacco Counseling Counseling given: Not Answered   Clinical Intake:  Pre-visit preparation completed: Yes  Pain : No/denies pain  BMI - recorded: 18.18 Nutritional Status: BMI <19  Underweight Nutritional Risks: None Diabetes: No  How often do you need to have someone help you when you read instructions, pamphlets, or other written materials from your doctor or pharmacy?: 1 - Never  Activities of Daily Living    03/25/2023   10:21 AM  In your present state of health, do you have any difficulty performing the following activities:  Hearing? 0  Vision? 1  Comment macular degeneration  Difficulty concentrating or making decisions? 0  Walking or climbing stairs? 0  Dressing or bathing? 0  Doing errands, shopping? 0  Preparing Food and eating ? N  Using the Toilet? N  In the past six months, have you accidently leaked urine? N  Do you have problems with loss of bowel control? N  Managing your Medications? N  Managing your Finances? N  Housekeeping or managing your Housekeeping? N    Patient Care Team: Sharlene Dory, DO as PCP - General (Family Medicine)  Indicate any recent Medical Services you may have received from other than [facility] providers in the past year (date may be approximate).     Assessment:   This is a routine wellness examination for Sherri Parks.  Hearing/Vision screen No results found.  Dietary issues and exercise activities discussed: Current Exercise Habits: Structured exercise class, Type of exercise: Other - see comments;strength training/weights (Silver Sneakers, uses machines at gym), Time (Minutes): 60, Frequency (Times/Week): 3, Weekly Exercise (Minutes/Week): 180, Intensity: Mild, Exercise limited by: None identified   Goals Addressed   None    Depression Screen    03/25/2023   10:30 AM 09/24/2022    4:48 PM  PHQ 2/9 Scores  PHQ - 2 Score 0 0    Fall Risk    03/25/2023    10:24 AM  Fall Risk   Falls in the past year? 0  Number falls in past yr: 0  Injury with Fall? 0  Risk for fall due to : No Fall Risks  Follow up Falls evaluation completed    FALL RISK PREVENTION PERTAINING TO THE HOME:  Any stairs in or around the home? Yes  Home free of loose throw rugs in walkways, pet beds, electrical cords, etc? Yes  Adequate lighting in your home to reduce risk of falls? Yes   ASSISTIVE DEVICES UTILIZED TO PREVENT FALLS:  Life alert? No  Use of a cane, walker or w/c? No  Grab bars in the bathroom? Yes  Shower chair or bench in shower?  Built in seat Elevated toilet seat or a handicapped  toilet? No   TIMED UP AND GO:  Was the test performed?  No, audio visit .    Cognitive Function:      01/18/2023    1:45 PM 04/29/2022    3:36 PM  Montreal Cognitive Assessment   Visuospatial/ Executive (0/5) 3 4  Naming (0/3) 2 2  Attention: Read list of digits (0/2) 2 2  Attention: Read list of letters (0/1) 0 1  Attention: Serial 7 subtraction starting at 100 (0/3) 3 0  Language: Repeat phrase (0/2) 2 2  Language : Fluency (0/1) 0 0  Abstraction (0/2) 1 2  Delayed Recall (0/5) 3 5  Orientation (0/6) 6 6  Total 22 24  Adjusted Score (based on education) 22 24      03/25/2023   10:36 AM  6CIT Screen  What Year? 0 points  What month? 0 points  What time? 0 points  Count back from 20 0 points  Months in reverse 0 points  Repeat phrase 0 points  Total Score 0 points    Immunizations Immunization History  Administered Date(s) Administered   Fluad Quad(high Dose 65+) 09/27/2022   Moderna Sars-Covid-2 Vaccination 01/08/2020, 02/05/2020   PNEUMOCOCCAL CONJUGATE-20 10/25/2022   Pneumococcal Conjugate-13 07/14/2015   Pneumococcal Polysaccharide-23 07/06/2011, 07/19/2017    TDAP status: Up to date  Flu Vaccine status: Up to date  Pneumococcal vaccine status: Up to date  Covid-19 vaccine status: Information provided on how to obtain vaccines.    Qualifies for Shingles Vaccine? Yes   Zostavax completed No   Shingrix Completed?: No.    Education has been provided regarding the importance of this vaccine. Patient has been advised to call insurance company to determine out of pocket expense if they have not yet received this vaccine. Advised may also receive vaccine at local pharmacy or [facility]. Verbalized acceptance and understanding.  Screening Tests Parks Maintenance  Topic Date Due   Hepatitis C Screening  Never done   DTaP/Tdap/Td (1 - Tdap) Never done   Medicare Annual Wellness (AWV)  06/13/2021   COVID-19 Vaccine (3 - Moderna risk series) 04/25/2023 (Originally 03/04/2020)   Zoster Vaccines- Shingrix (1 of 2) 04/25/2023 (Originally 12/03/1965)   INFLUENZA VACCINE  07/14/2023   Pneumonia Vaccine 52+ Years old  Completed   DEXA SCAN  Completed   HPV VACCINES  Aged Out    Parks Maintenance  Parks Maintenance Due  Topic Date Due   Hepatitis C Screening  Never done   DTaP/Tdap/Td (1 - Tdap) Never done   Medicare Annual Wellness (AWV)  06/13/2021    Colorectal cancer screening: No longer required.   Mammogram status: Completed 10/26/22. Repeat every year [facility]  Bone Density status: Completed 10/25/21. Results reflect: Bone density results: OSTEOPOROSIS. Repeat every 2 years.  Lung Cancer Screening: (Low Dose CT Chest recommended if Age 53-80 years, 30 pack-year currently smoking OR have quit w/in 15years.) does not qualify.   Additional Screening:  Hepatitis C Screening: does qualify; Completed N/a  Vision Screening: Recommended annual ophthalmology exams for early detection of glaucoma and other disorders of the eye. Is the patient up to date with their annual eye exam?  Yes  Who is the provider or what is the name of the office in which the patient attends annual eye exams? Dr. Sondra Barges If pt is not established with a provider, would they like to be referred to a provider to establish care? No .   Dental  Screening: Recommended annual dental exams for proper oral  hygiene  Community Resource Referral / Chronic Care Management: CRR required this visit?  No   CCM required this visit?  No      Plan:     I have personally reviewed and noted the following in the patient's chart:   Medical and social history Use of alcohol, tobacco or illicit drugs  Current medications and supplements including opioid prescriptions. Patient is not currently taking opioid prescriptions. Functional ability and status Nutritional status Physical activity Advanced directives List of other physicians Hospitalizations, surgeries, and ER visits in previous 12 months Vitals Screenings to include cognitive, depression, and falls Referrals and appointments  In addition, I have reviewed and discussed with patient certain preventive protocols, quality metrics, and best practice recommendations. A written personalized care plan for preventive services as well as general preventive Parks recommendations were provided to patient.   Due to this being a telephonic visit, the after visit summary with patients personalized plan was offered to patient via mail or my-chart. Patient would like to access on my-chart.  Donne Anon, [place]   03/25/2023   Nurse Notes: None

## 2023-03-25 NOTE — Patient Instructions (Signed)
Sherri Parks , Thank you for taking time to come for your Medicare Wellness Visit. I appreciate your ongoing commitment to your health goals. Please review the following plan we discussed and let me know if I can assist you in the future.   These are the goals we discussed:  Goals   None     This is a list of the screening recommended for you and due dates:  Health Maintenance  Topic Date Due   Hepatitis C Screening: USPSTF Recommendation to screen - Ages 22-79 yo.  Never done   DTaP/Tdap/Td vaccine (1 - Tdap) Never done   COVID-19 Vaccine (3 - Moderna risk series) 04/25/2023*   Zoster (Shingles) Vaccine (1 of 2) 04/25/2023*   Flu Shot  07/14/2023   Medicare Annual Wellness Visit  03/24/2024   Pneumonia Vaccine  Completed   DEXA scan (bone density measurement)  Completed   HPV Vaccine  Aged Out  *Topic was postponed. The date shown is not the original due date.     Next appointment: Follow up in one year for your annual wellness visit.   Preventive Care 70 Years and Older, Female Preventive care refers to lifestyle choices and visits with your health care provider that can promote health and wellness. What does preventive care include? A yearly physical exam. This is also called an annual well check. Dental exams once or twice a year. Routine eye exams. Ask your health care provider how often you should have your eyes checked. Personal lifestyle choices, including: Daily care of your teeth and gums. Regular physical activity. Eating a healthy diet. Avoiding tobacco and drug use. Limiting alcohol use. Practicing safe sex. Taking low-dose aspirin every day. Taking vitamin and mineral supplements as recommended by your health care provider. What happens during an annual well check? The services and screenings done by your health care provider during your annual well check will depend on your age, overall health, lifestyle risk factors, and family history of disease. Counseling   Your health care provider may ask you questions about your: Alcohol use. Tobacco use. Drug use. Emotional well-being. Home and relationship well-being. Sexual activity. Eating habits. History of falls. Memory and ability to understand (cognition). Work and work Astronomer. Reproductive health. Screening  You may have the following tests or measurements: Height, weight, and BMI. Blood pressure. Lipid and cholesterol levels. These may be checked every 5 years, or more frequently if you are over 28 years old. Skin check. Lung cancer screening. You may have this screening every year starting at age 41 if you have a 30-pack-year history of smoking and currently smoke or have quit within the past 15 years. Fecal occult blood test (FOBT) of the stool. You may have this test every year starting at age 31. Flexible sigmoidoscopy or colonoscopy. You may have a sigmoidoscopy every 5 years or a colonoscopy every 10 years starting at age 52. Hepatitis C blood test. Hepatitis B blood test. Sexually transmitted disease (STD) testing. Diabetes screening. This is done by checking your blood sugar (glucose) after you have not eaten for a while (fasting). You may have this done every 1-3 years. Bone density scan. This is done to screen for osteoporosis. You may have this done starting at age 56. Mammogram. This may be done every 1-2 years. Talk to your health care provider about how often you should have regular mammograms. Talk with your health care provider about your test results, treatment options, and if necessary, the need for more tests. Vaccines  Your health care provider may recommend certain vaccines, such as: Influenza vaccine. This is recommended every year. Tetanus, diphtheria, and acellular pertussis (Tdap, Td) vaccine. You may need a Td booster every 10 years. Zoster vaccine. You may need this after age 43. Pneumococcal 13-valent conjugate (PCV13) vaccine. One dose is recommended  after age 88. Pneumococcal polysaccharide (PPSV23) vaccine. One dose is recommended after age 83. Talk to your health care provider about which screenings and vaccines you need and how often you need them. This information is not intended to replace advice given to you by your health care provider. Make sure you discuss any questions you have with your health care provider. Document Released: 12/26/2015 Document Revised: 08/18/2016 Document Reviewed: 09/30/2015 Elsevier Interactive Patient Education  2017 [place] Prevention in the Home Falls can cause injuries. They can happen to people of all ages. There are many things you can do to make your home safe and to help prevent falls. What can I do on the outside of my home? Regularly fix the edges of walkways and driveways and fix any cracks. Remove anything that might make you trip as you walk through a door, such as a raised step or threshold. Trim any bushes or trees on the path to your home. Use bright outdoor lighting. Clear any walking paths of anything that might make someone trip, such as rocks or tools. Regularly check to see if handrails are loose or broken. Make sure that both sides of any steps have handrails. Any raised decks and porches should have guardrails on the edges. Have any leaves, snow, or ice cleared regularly. Use sand or salt on walking paths during winter. Clean up any spills in your garage right away. This includes oil or grease spills. What can I do in the bathroom? Use night lights. Install grab bars by the toilet and in the tub and shower. Do not use towel bars as grab bars. Use non-skid mats or decals in the tub or shower. If you need to sit down in the shower, use a plastic, non-slip stool. Keep the floor dry. Clean up any water that spills on the floor as soon as it happens. Remove soap buildup in the tub or shower regularly. Attach bath mats securely with double-sided non-slip rug tape. Do not  have throw rugs and other things on the floor that can make you trip. What can I do in the bedroom? Use night lights. Make sure that you have a light by your bed that is easy to reach. Do not use any sheets or blankets that are too big for your bed. They should not hang down onto the floor. Have a firm chair that has side arms. You can use this for support while you get dressed. Do not have throw rugs and other things on the floor that can make you trip. What can I do in the kitchen? Clean up any spills right away. Avoid walking on wet floors. Keep items that you use a lot in easy-to-reach places. If you need to reach something above you, use a strong step stool that has a grab bar. Keep electrical cords out of the way. Do not use floor polish or wax that makes floors slippery. If you must use wax, use non-skid floor wax. Do not have throw rugs and other things on the floor that can make you trip. What can I do with my stairs? Do not leave any items on the stairs. Make sure that there are  handrails on both sides of the stairs and use them. Fix handrails that are broken or loose. Make sure that handrails are as long as the stairways. Check any carpeting to make sure that it is firmly attached to the stairs. Fix any carpet that is loose or worn. Avoid having throw rugs at the top or bottom of the stairs. If you do have throw rugs, attach them to the floor with carpet tape. Make sure that you have a light switch at the top of the stairs and the bottom of the stairs. If you do not have them, ask someone to add them for you. What else can I do to help prevent falls? Wear shoes that: Do not have high heels. Have rubber bottoms. Are comfortable and fit you well. Are closed at the toe. Do not wear sandals. If you use a stepladder: Make sure that it is fully opened. Do not climb a closed stepladder. Make sure that both sides of the stepladder are locked into place. Ask someone to hold it for  you, if possible. Clearly mark and make sure that you can see: Any grab bars or handrails. First and last steps. Where the edge of each step is. Use tools that help you move around (mobility aids) if they are needed. These include: Canes. Walkers. Scooters. Crutches. Turn on the lights when you go into a dark area. Replace any light bulbs as soon as they burn out. Set up your furniture so you have a clear path. Avoid moving your furniture around. If any of your floors are uneven, fix them. If there are any pets around you, be aware of where they are. Review your medicines with your doctor. Some medicines can make you feel dizzy. This can increase your chance of falling. Ask your doctor what other things that you can do to help prevent falls. This information is not intended to replace advice given to you by your health care provider. Make sure you discuss any questions you have with your health care provider. Document Released: 09/25/2009 Document Revised: 05/06/2016 Document Reviewed: 01/03/2015 Elsevier Interactive Patient Education  2017 [employer].

## 2023-06-10 ENCOUNTER — Encounter: Payer: Self-pay | Admitting: Family Medicine

## 2023-06-10 ENCOUNTER — Ambulatory Visit (INDEPENDENT_AMBULATORY_CARE_PROVIDER_SITE_OTHER): Payer: Medicare HMO | Admitting: Family Medicine

## 2023-06-10 VITALS — BP 130/82 | HR 96 | Temp 98.1°F | Ht 59.0 in | Wt 87.5 lb

## 2023-06-10 DIAGNOSIS — E785 Hyperlipidemia, unspecified: Secondary | ICD-10-CM

## 2023-06-10 DIAGNOSIS — Z0000 Encounter for general adult medical examination without abnormal findings: Secondary | ICD-10-CM | POA: Diagnosis not present

## 2023-06-10 DIAGNOSIS — G8929 Other chronic pain: Secondary | ICD-10-CM | POA: Diagnosis not present

## 2023-06-10 DIAGNOSIS — M545 Low back pain, unspecified: Secondary | ICD-10-CM

## 2023-06-10 LAB — COMPREHENSIVE METABOLIC PANEL
ALT: 14 U/L (ref 0–35)
AST: 22 U/L (ref 0–37)
Albumin: 4.2 g/dL (ref 3.5–5.2)
Alkaline Phosphatase: 62 U/L (ref 39–117)
BUN: 19 mg/dL (ref 6–23)
CO2: 32 mEq/L (ref 19–32)
Calcium: 9.7 mg/dL (ref 8.4–10.5)
Chloride: 104 mEq/L (ref 96–112)
Creatinine, Ser: 0.74 mg/dL (ref 0.40–1.20)
GFR: 77.99 mL/min (ref >60.00)
Glucose, Bld: 88 mg/dL (ref 70–99)
Potassium: 4.4 mEq/L (ref 3.5–5.1)
Sodium: 141 mEq/L (ref 135–145)
Total Bilirubin: 0.5 mg/dL (ref 0.2–1.2)
Total Protein: 6.8 g/dL (ref 6.0–8.3)

## 2023-06-10 LAB — LIPID PANEL
Cholesterol: 173 mg/dL (ref 0–200)
HDL: 80.4 mg/dL (ref >39.00)
LDL Cholesterol: 78 mg/dL (ref 0–99)
NonHDL: 92.69
Total CHOL/HDL Ratio: 2
Triglycerides: 72 mg/dL (ref 0.0–149.0)
VLDL: 14.4 mg/dL (ref 0.0–40.0)

## 2023-06-10 NOTE — Progress Notes (Signed)
Chief Complaint  Patient presents with   Annual Exam     Well Woman Sherri Parks is here for a complete physical.   Her last physical was >1 year ago.  Current diet: in general, a "healthy" diet. Current exercise: walking, working w Systems analyst. Weight is stable and she denies daytime fatigue. Seatbelt? Yes Advanced directive? Yes, in process of being scanned  Health Maintenance Shingrix- Yes DEXA- Yes Tetanus- Due Pneumonia- Yes Hep C screen- Yes  Low/mid back pain Has been going on for several mo. Hurts in the lower/mid back area. No inj or change in activity. No loss of bowel/bladder function, bruising, redness, swelling, neuro s/s's. No radiation, worsen when bending over. Has not tried anything thus far. Hx of osteoporosis.   Past Medical History:  Diagnosis Date   Hypercholesteremia    Hypertension    Murmur, cardiac    Osteoporosis      Past Surgical History:  Procedure Laterality Date   APPENDECTOMY     CESAREAN SECTION     ROTATOR CUFF REPAIR      Medications  Current Outpatient Medications on File Prior to Visit  Medication Sig Dispense Refill   Cholecalciferol (D3 PO) Take 1 tablet by mouth daily.     doxepin (SINEQUAN) 10 MG capsule Take 1 capsule (10 mg total) by mouth at bedtime. 90 capsule 3   fish oil-omega-3 fatty acids 1000 MG capsule Take 1 g by mouth daily.     Magnesium 200 MG CHEW Chew by mouth.     Multiple Vitamins-Minerals (PRESERVISION AREDS PO) Take 1 tablet by mouth daily.     Probiotic Product (PROBIOTIC ADVANCED PO) Take by mouth.     simvastatin (ZOCOR) 20 MG tablet Take 20 mg by mouth at bedtime.     Ubiquinol 100 MG CAPS Take by mouth.     UNABLE TO FIND Take 1 tablet by mouth daily. Med Name: viactive calcium     vitamin B-12 (CYANOCOBALAMIN) 1000 MCG tablet Take 1,000 mcg by mouth daily.      Allergies Allergies  Allergen Reactions   Amoxicillin Nausea And Vomiting    Review of Systems: Constitutional:  no  fevers Eye:  no recent significant change in vision Ears:  No changes in hearing Nose/Mouth/Throat:  no complaints of nasal congestion, no sore throat Cardiovascular: no chest pain Respiratory:  No shortness of breath Gastrointestinal:  No change in bowel habits GU:  Female: negative for dysuria Integumentary:  no abnormal skin lesions reported MSK: +low back pain Neurologic:  no headaches Endocrine:  denies unexplained weight changes  Exam BP 130/82 (BP Location: Left Arm, Patient Position: Sitting, Cuff Size: Normal)   Pulse 96   Temp 98.1 F (36.7 C) (Oral)   Ht 4\' 11"  (1.499 m)   Wt 87 lb 8 oz (39.7 kg)   SpO2 97%   BMI 17.67 kg/m  General:  well developed, well nourished, in no apparent distress Skin:  no significant moles, warts, or growths Head:  no masses, lesions, or tenderness Eyes:  pupils equal and round, sclera anicteric without injection Ears:  canals without lesions, TMs shiny without retraction, no obvious effusion, no erythema Nose:  nares patent, mucosa normal, and no drainage Throat/Pharynx:  lips and gingiva without lesion; tongue and uvula midline; non-inflamed pharynx; no exudates or postnasal drainage Neck: neck supple without adenopathy, thyromegaly, or masses Lungs:  clear to auscultation, breath sounds equal bilaterally, no respiratory distress Cardio:  regular rate and rhythm, no bruits or LE  edema Abdomen:  abdomen soft, nontender; bowel sounds normal; no masses or organomegaly Genital: Deferred MSK: No ttp over midline or parasp msc b/l in thor or lumbar region, slightly exaggerated kyphosis in thor region Neuro:  gait normal; deep tendon reflexes normal and symmetric; neg straight leg b/l Psych: Nml affect and mood  Assessment and Plan  Well adult exam  Hyperlipidemia, unspecified hyperlipidemia type - Plan: Comprehensive metabolic panel, Lipid panel  Chronic low back pain, unspecified back pain laterality, unspecified whether sciatica  present   Well 77 y.o. female. Counseled on diet and exercise. Shingrix, Td and RSV vaccinations recommended to get at the pharmacy.  Other orders as above. Back pain: Chronic, uncontrolled. Tylenol, heat, ice, stretches/exercises. Home PT if no better.  Follow up 6 mo. The patient voiced understanding and agreement to the plan.  Jilda Roche [place], DO 06/10/23 11:16 AM

## 2023-06-10 NOTE — Patient Instructions (Addendum)
Give Sherri Parks 2-3 business days to get the results of your labs back.   Keep the diet clean and stay active.  The Shingrix vaccine (for shingles) is a 2 shot series spaced 2-6 months apart. It can make people feel low energy, achy and almost like they have the flu for 48 hours after injection. 1/5 people can have nausea and/or vomiting. Please plan accordingly when deciding on when to get this shot. Call your pharmacy to get this. The second shot of the series is less severe regarding the side effects, but it still lasts 48 hours.   We do recommend the tetanus (tetanus booster) shot every 10 years.  We recommend the RSV vaccine to protect against a common virus causing upper respiratory infections.   If no improvement in the next month or so with your back, send me a message or call and we can order home health for physical therapy.   Let Sherri Parks know if you need anything.  EXERCISES  RANGE OF MOTION (ROM) AND STRETCHING EXERCISES - Low Back Pain Most people with lower back pain will find that their symptoms get worse with excessive bending forward (flexion) or arching at the lower back (extension). The exercises that will help resolve your symptoms will focus on the opposite motion.  If you have pain, numbness or tingling which travels down into your buttocks, leg or foot, the goal of the therapy is for these symptoms to move closer to your back and eventually resolve. Sometimes, these leg symptoms will get better, but your lower back pain may worsen. This is often an indication of progress in your rehabilitation. Be very alert to any changes in your symptoms and the activities in which you participated in the 24 hours prior to the change. Sharing this information with your caregiver will allow him or her to most efficiently treat your condition. These exercises may help you when beginning to rehabilitate your injury. Your symptoms may resolve with or without further involvement from your physician, physical  therapist or athletic trainer. While completing these exercises, remember:  Restoring tissue flexibility helps normal motion to return to the joints. This allows healthier, less painful movement and activity. An effective stretch should be held for at least 30 seconds. A stretch should never be painful. You should only feel a gentle lengthening or release in the stretched tissue. FLEXION RANGE OF MOTION AND STRETCHING EXERCISES:  STRETCH - Flexion, Single Knee to Chest  Lie on a firm bed or floor with both legs extended in front of you. Keeping one leg in contact with the floor, bring your opposite knee to your chest. Hold your leg in place by either grabbing behind your thigh or at your knee. Pull until you feel a gentle stretch in your low back. Hold 30 seconds. Slowly release your grasp and repeat the exercise with the opposite side. Repeat 2 times. Complete this exercise 3 times per week.   STRETCH - Flexion, Double Knee to Chest Lie on a firm bed or floor with both legs extended in front of you. Keeping one leg in contact with the floor, bring your opposite knee to your chest. Tense your stomach muscles to support your back and then lift your other knee to your chest. Hold your legs in place by either grabbing behind your thighs or at your knees. Pull both knees toward your chest until you feel a gentle stretch in your low back. Hold 30 seconds. Tense your stomach muscles and slowly return one leg  at a time to the floor. Repeat 2 times. Complete this exercise 3 times per week.   STRETCH - Low Trunk Rotation Lie on a firm bed or floor. Keeping your legs in front of you, bend your knees so they are both pointed toward the ceiling and your feet are flat on the floor. Extend your arms out to the side. This will stabilize your upper body by keeping your shoulders in contact with the floor. Gently and slowly drop both knees together to one side until you feel a gentle stretch in your low back.  Hold for 30 seconds. Tense your stomach muscles to support your lower back as you bring your knees back to the starting position. Repeat the exercise to the other side. Repeat 2 times. Complete this exercise at least 3 times per week.   EXTENSION RANGE OF MOTION AND FLEXIBILITY EXERCISES:  STRETCH - Extension, Prone on Elbows  Lie on your stomach on the floor, a bed will be too soft. Place your palms about shoulder width apart and at the height of your head. Place your elbows under your shoulders. If this is too painful, stack pillows under your chest. Allow your body to relax so that your hips drop lower and make contact more completely with the floor. Hold this position for 30 seconds. Slowly return to lying flat on the floor. Repeat 2 times. Complete this exercise 3 times per week.   RANGE OF MOTION - Extension, Prone Press Ups Lie on your stomach on the floor, a bed will be too soft. Place your palms about shoulder width apart and at the height of your head. Keeping your back as relaxed as possible, slowly straighten your elbows while keeping your hips on the floor. You may adjust the placement of your hands to maximize your comfort. As you gain motion, your hands will come more underneath your shoulders. Hold this position 30 seconds. Slowly return to lying flat on the floor. Repeat 2 times. Complete this exercise 3 times per week.   RANGE OF MOTION- Quadruped, Neutral Spine  Assume a hands and knees position on a firm surface. Keep your hands under your shoulders and your knees under your hips. You may place padding under your knees for comfort. Drop your head and point your tailbone toward the ground below you. This will round out your lower back like an angry cat. Hold this position for 30 seconds. Slowly lift your head and release your tail bone so that your back sags into a large arch, like an old horse. Hold this position for 30 seconds. Repeat this until you feel limber in your  low back. Now, find your "sweet spot." This will be the most comfortable position somewhere between the two previous positions. This is your neutral spine. Once you have found this position, tense your stomach muscles to support your low back. Hold this position for 30 seconds. Repeat 2 times. Complete this exercise 3 times per week.   STRENGTHENING EXERCISES - Low Back Sprain These exercises may help you when beginning to rehabilitate your injury. These exercises should be done near your "sweet spot." This is the neutral, low-back arch, somewhere between fully rounded and fully arched, that is your least painful position. When performed in this safe range of motion, these exercises can be used for people who have either a flexion or extension based injury. These exercises may resolve your symptoms with or without further involvement from your physician, physical therapist or athletic trainer. While completing  these exercises, remember:  Muscles can gain both the endurance and the strength needed for everyday activities through controlled exercises. Complete these exercises as instructed by your physician, physical therapist or athletic trainer. Increase the resistance and repetitions only as guided. You may experience muscle soreness or fatigue, but the pain or discomfort you are trying to eliminate should never worsen during these exercises. If this pain does worsen, stop and make certain you are following the directions exactly. If the pain is still present after adjustments, discontinue the exercise until you can discuss the trouble with your caregiver.  STRENGTHENING - Deep Abdominals, Pelvic Tilt  Lie on a firm bed or floor. Keeping your legs in front of you, bend your knees so they are both pointed toward the ceiling and your feet are flat on the floor. Tense your lower abdominal muscles to press your low back into the floor. This motion will rotate your pelvis so that your tail bone is scooping  upwards rather than pointing at your feet or into the floor. With a gentle tension and even breathing, hold this position for 3 seconds. Repeat 2 times. Complete this exercise 3 times per week.   STRENGTHENING - Abdominals, Crunches  Lie on a firm bed or floor. Keeping your legs in front of you, bend your knees so they are both pointed toward the ceiling and your feet are flat on the floor. Cross your arms over your chest. Slightly tip your chin down without bending your neck. Tense your abdominals and slowly lift your trunk high enough to just clear your shoulder blades. Lifting higher can put excessive stress on the lower back and does not further strengthen your abdominal muscles. Control your return to the starting position. Repeat 2 times. Complete this exercise 3 times per week.   STRENGTHENING - Quadruped, Opposite UE/LE Lift  Assume a hands and knees position on a firm surface. Keep your hands under your shoulders and your knees under your hips. You may place padding under your knees for comfort. Find your neutral spine and gently tense your abdominal muscles so that you can maintain this position. Your shoulders and hips should form a rectangle that is parallel with the floor and is not twisted. Keeping your trunk steady, lift your right hand no higher than your shoulder and then your left leg no higher than your hip. Make sure you are not holding your breath. Hold this position for 30 seconds. Continuing to keep your abdominal muscles tense and your back steady, slowly return to your starting position. Repeat with the opposite arm and leg. Repeat 2 times. Complete this exercise 3 times per week.   STRENGTHENING - Abdominals and Quadriceps, Straight Leg Raise  Lie on a firm bed or floor with both legs extended in front of you. Keeping one leg in contact with the floor, bend the other knee so that your foot can rest flat on the floor. Find your neutral spine, and tense your abdominal  muscles to maintain your spinal position throughout the exercise. Slowly lift your straight leg off the floor about 6 inches for a count of 3, making sure to not hold your breath. Still keeping your neutral spine, slowly lower your leg all the way to the floor. Repeat this exercise with each leg 2 times. Complete this exercise 3 times per week.  POSTURE AND BODY MECHANICS CONSIDERATIONS - Low Back Sprain Keeping correct posture when sitting, standing or completing your activities will reduce the stress put on different body  tissues, allowing injured tissues a chance to heal and limiting painful experiences. The following are general guidelines for improved posture.  While reading these guidelines, remember: The exercises prescribed by your provider will help you have the flexibility and strength to maintain correct postures. The correct posture provides the best environment for your joints to work. All of your joints have less wear and tear when properly supported by a spine with good posture. This means you will experience a healthier, less painful body. Correct posture must be practiced with all of your activities, especially prolonged sitting and standing. Correct posture is as important when doing repetitive low-stress activities (typing) as it is when doing a single heavy-load activity (lifting).  RESTING POSITIONS Consider which positions are most painful for you when choosing a resting position. If you have pain with flexion-based activities (sitting, bending, stooping, squatting), choose a position that allows you to rest in a less flexed posture. You would want to avoid curling into a fetal position on your side. If your pain worsens with extension-based activities (prolonged standing, working overhead), avoid resting in an extended position such as sleeping on your stomach. Most people will find more comfort when they rest with their spine in a more neutral position, neither too rounded nor too  arched. Lying on a non-sagging bed on your side with a pillow between your knees, or on your back with a pillow under your knees will often provide some relief. Keep in mind, being in any one position for a prolonged period of time, no matter how correct your posture, can still lead to stiffness.  PROPER SITTING POSTURE In order to minimize stress and discomfort on your spine, you must sit with correct posture. Sitting with good posture should be effortless for a healthy body. Returning to good posture is a gradual process. Many people can work toward this most comfortably by using various supports until they have the flexibility and strength to maintain this posture on their own. When sitting with proper posture, your ears will fall over your shoulders and your shoulders will fall over your hips. You should use the back of the chair to support your upper back. Your lower back will be in a neutral position, just slightly arched. You may place a small pillow or folded towel at the base of your lower back for  support.  When working at a desk, create an environment that supports good, upright posture. Without extra support, muscles tire, which leads to excessive strain on joints and other tissues. Keep these recommendations in mind:  CHAIR: A chair should be able to slide under your desk when your back makes contact with the back of the chair. This allows you to work closely. The chair's height should allow your eyes to be level with the upper part of your monitor and your hands to be slightly lower than your elbows.  BODY POSITION Your feet should make contact with the floor. If this is not possible, use a foot rest. Keep your ears over your shoulders. This will reduce stress on your neck and low back.  INCORRECT SITTING POSTURES  If you are feeling tired and unable to assume a healthy sitting posture, do not slouch or slump. This puts excessive strain on your back tissues, causing more damage and  pain. Healthier options include: Using more support, like a lumbar pillow. Switching tasks to something that requires you to be upright or walking. Talking a brief walk. Lying down to rest in a neutral-spine position.  PROLONGED STANDING WHILE SLIGHTLY LEANING FORWARD  When completing a task that requires you to lean forward while standing in one place for a long time, place either foot up on a stationary 2-4 inch high object to help maintain the best posture. When both feet are on the ground, the lower back tends to lose its slight inward curve. If this curve flattens (or becomes too large), then the back and your other joints will experience too much stress, tire more quickly, and can cause pain.  CORRECT STANDING POSTURES Proper standing posture should be assumed with all daily activities, even if they only take a few moments, like when brushing your teeth. As in sitting, your ears should fall over your shoulders and your shoulders should fall over your hips. You should keep a slight tension in your abdominal muscles to brace your spine. Your tailbone should point down to the ground, not behind your body, resulting in an over-extended swayback posture.   INCORRECT STANDING POSTURES  Common incorrect standing postures include a forward head, locked knees and/or an excessive swayback. WALKING Walk with an upright posture. Your ears, shoulders and hips should all line-up.  PROLONGED ACTIVITY IN A FLEXED POSITION When completing a task that requires you to bend forward at your waist or lean over a low surface, try to find a way to stabilize 3 out of 4 of your limbs. You can place a hand or elbow on your thigh or rest a knee on the surface you are reaching across. This will provide you more stability, so that your muscles do not tire as quickly. By keeping your knees relaxed, or slightly bent, you will also reduce stress across your lower back. CORRECT LIFTING TECHNIQUES  DO : Assume a wide  stance. This will provide you more stability and the opportunity to get as close as possible to the object which you are lifting. Tense your abdominals to brace your spine. Bend at the knees and hips. Keeping your back locked in a neutral-spine position, lift using your leg muscles. Lift with your legs, keeping your back straight. Test the weight of unknown objects before attempting to lift them. Try to keep your elbows locked down at your sides in order get the best strength from your shoulders when carrying an object.   Always ask for help when lifting heavy or awkward objects. INCORRECT LIFTING TECHNIQUES DO NOT:  Lock your knees when lifting, even if it is a small object. Bend and twist. Pivot at your feet or move your feet when needing to change directions. Assume that you can safely pick up even a paperclip without proper posture.

## 2023-06-13 ENCOUNTER — Encounter: Payer: Self-pay | Admitting: Family Medicine

## 2023-06-22 DIAGNOSIS — R69 Illness, unspecified: Secondary | ICD-10-CM | POA: Diagnosis not present

## 2023-07-05 DIAGNOSIS — H353132 Nonexudative age-related macular degeneration, bilateral, intermediate dry stage: Secondary | ICD-10-CM | POA: Diagnosis not present

## 2023-07-05 DIAGNOSIS — H35372 Puckering of macula, left eye: Secondary | ICD-10-CM | POA: Diagnosis not present

## 2023-07-05 DIAGNOSIS — H3554 Dystrophies primarily involving the retinal pigment epithelium: Secondary | ICD-10-CM | POA: Diagnosis not present

## 2023-07-05 DIAGNOSIS — H35363 Drusen (degenerative) of macula, bilateral: Secondary | ICD-10-CM | POA: Diagnosis not present

## 2023-07-25 DIAGNOSIS — Z0100 Encounter for examination of eyes and vision without abnormal findings: Secondary | ICD-10-CM | POA: Diagnosis not present

## 2023-07-28 ENCOUNTER — Ambulatory Visit: Payer: Medicare HMO | Admitting: Neurology

## 2023-12-12 ENCOUNTER — Ambulatory Visit (INDEPENDENT_AMBULATORY_CARE_PROVIDER_SITE_OTHER): Payer: Medicare HMO | Admitting: Family Medicine

## 2023-12-12 ENCOUNTER — Telehealth: Payer: Self-pay

## 2023-12-12 ENCOUNTER — Encounter: Payer: Self-pay | Admitting: Family Medicine

## 2023-12-12 VITALS — BP 114/78 | HR 91 | Ht 59.0 in | Wt 82.4 lb

## 2023-12-12 DIAGNOSIS — E785 Hyperlipidemia, unspecified: Secondary | ICD-10-CM

## 2023-12-12 DIAGNOSIS — F411 Generalized anxiety disorder: Secondary | ICD-10-CM

## 2023-12-12 DIAGNOSIS — R2689 Other abnormalities of gait and mobility: Secondary | ICD-10-CM

## 2023-12-12 DIAGNOSIS — G4700 Insomnia, unspecified: Secondary | ICD-10-CM | POA: Diagnosis not present

## 2023-12-12 DIAGNOSIS — I679 Cerebrovascular disease, unspecified: Secondary | ICD-10-CM

## 2023-12-12 MED ORDER — DOXEPIN HCL 10 MG PO CAPS: 10.0000 mg | ORAL_CAPSULE | Freq: Every day | ORAL | 3 refills | Status: DC

## 2023-12-12 MED ORDER — SERTRALINE HCL 25 MG PO TABS: 25.0000 mg | ORAL_TABLET | Freq: Every day | ORAL | 3 refills | Status: DC

## 2023-12-12 MED ORDER — SIMVASTATIN 20 MG PO TABS: 20.0000 mg | ORAL_TABLET | Freq: Every day | ORAL | 3 refills | Status: DC

## 2023-12-12 NOTE — Telephone Encounter (Signed)
PA denied.    Your plan does not allow coverage of this medication based on your prescriber answering No to the following  question(s): Has the patient tried two of the following alternative drugs: buspirone, duloxetine, escitalopram, sertraline, or  venlafaxine extended-release? No.

## 2023-12-12 NOTE — Telephone Encounter (Signed)
That's weird, it's not used for anxiety/depression in her case. Have her use GoodRx for this, should be affordable with or without coverage at [facility]. Ty.

## 2023-12-12 NOTE — Progress Notes (Signed)
Chief Complaint  Patient presents with   Follow-up    Concerns with memory loss  Pt would like to go over all current meds, pt would like to know if she still needs to take her cholesterol med    Subjective: Hyperlipidemia Patient presents for Hyperlipidemia follow up. Here w spouse who helps w hx.  Currently taking Zocor 20 mg/d and compliance with treatment thus far has been good. She denies myalgias. She is adhering to a healthy diet. Exercise: walking, wt lifting No Cp or SOB.  The patient is not known to have coexisting coronary artery disease. +hx of CVA.   Insomnia Taking doxepin 10 mg qhs. Does not take it every night. Spouse notices a marked improvement when she does take it.  No adverse effects.  Balance Trouble with walking since having covid.  She does exercise as noted above.  She has not had any falls but is much less steady on her feet.  She used to dance more but has stopped doing that due to the balance.  She has tried working with a Pharmacist, hospital at [facility] but has not noticed much improvement.  Sadness/anxiety She always reports having been tearful but this is more pronounced over the past year or so.  Her declining health and memory are major contributors to this.  No homicidal or suicidal ideation.  No self-medication.  She has never been on a medication for this before.  She is not following with a therapist or counselor.  Past Medical History:  Diagnosis Date   Hypercholesteremia    Hypertension    Murmur, cardiac    Osteoporosis     Objective: BP 114/78 (BP Location: Left Arm, Patient Position: Sitting, Cuff Size: Normal)   Pulse 91   Ht 4\' 11"  (1.499 m)   Wt 82 lb 6.4 oz (37.4 kg)   SpO2 96%   BMI 16.64 kg/m  General: Awake, appears stated age HEENT: MMM Heart: RRR, no LE edema, no bruits Lungs: CTAB, no rales, wheezes or rhonchi. No accessory muscle use Psych: Normal affect  Assessment and Plan: Cerebral vascular disease - Plan:  simvastatin (ZOCOR) 20 MG tablet  Hyperlipidemia, unspecified hyperlipidemia type - Plan: simvastatin (ZOCOR) 20 MG tablet  Insomnia, unspecified type - Plan: doxepin (SINEQUAN) 10 MG capsule  Balance disorder - Plan: Ambulatory referral to Physical Therapy  GAD (generalized anxiety disorder) - Plan: sertraline (ZOLOFT) 25 MG tablet  1/2.  Chronic, stable.  Continue Zocor 20 mg daily.  Counseled on diet and exercise. 3.  Chronic, stable.  Continue doxepin 10 mg nightly. 4.  Continue exercising.  Add referral to physical therapy. 5.  Chronic, unstable.  Start Zoloft 25 mg daily.  Counseled information provided in AVS. F/u 6 weeks to recheck. The patient and her spouse voiced understanding and agreement to the plan.  Sherri Parks [place], DO 12/12/23  12:12 PM

## 2023-12-12 NOTE — Patient Instructions (Addendum)
Give [place] 2-3 business days to get the results of your labs back.   If you do not hear anything about your referral in the next 1-2 weeks, call our office and ask for an update.  Double check with the pharmacy to see if you had your tetanus booster recently.   Keep the diet clean and stay active.  Please consider counseling. Contact [phone] to schedule an appointment or inquire about cost/insurance coverage.  [facility] located at [address], [address], [place], [place].  Phone number = [phone].  Dr. Regan Lemming - Adult Psychiatry.    [facility] located at [address] [place], [place], [place]. Phone number = [phone].   [facility] located at [address] [place], [place].  Phone number = [phone].   [facility] located at [address] [place], [place], [place].  Phone number = [phone].

## 2023-12-12 NOTE — Telephone Encounter (Signed)
PA initiated via Covermymeds; KEY: [id number]. Awaiting determination.

## 2023-12-13 NOTE — Telephone Encounter (Signed)
Called pt spoke with her about medication and GoodRx, Pt stated her husband is not home will  call back to discuss.

## 2023-12-15 ENCOUNTER — Telehealth: Payer: Self-pay

## 2023-12-15 NOTE — Telephone Encounter (Signed)
 Copied from CRM [id number]. Topic: Clinical - Medication Question >> Dec 13, 2023  2:50 PM Adaysia C wrote: Reason for CRM: Katherine([facility] pharmacist) called in on behalf of patient to ask the provider if it is ok for the patient to take the doxepin  (SINEQUAN ) 10 MG and sertraline  (ZOLOFT ) 25 MG at the same time? Please follow up with pharmacist [phone]

## 2023-12-15 NOTE — Telephone Encounter (Signed)
 Yes

## 2023-12-15 NOTE — Telephone Encounter (Signed)
 Pharmacist notified.

## 2023-12-27 ENCOUNTER — Ambulatory Visit: Payer: Medicare HMO | Attending: Family Medicine | Admitting: Physical Therapy

## 2024-01-23 ENCOUNTER — Other Ambulatory Visit (HOSPITAL_BASED_OUTPATIENT_CLINIC_OR_DEPARTMENT_OTHER): Payer: Self-pay

## 2024-01-23 ENCOUNTER — Encounter: Payer: Self-pay | Admitting: Family Medicine

## 2024-01-23 ENCOUNTER — Ambulatory Visit: Payer: Medicare HMO | Admitting: Family Medicine

## 2024-01-23 VITALS — BP 122/80 | HR 98 | Temp 97.8°F | Resp 16 | Ht 59.0 in | Wt 86.0 lb

## 2024-01-23 DIAGNOSIS — F324 Major depressive disorder, single episode, in partial remission: Secondary | ICD-10-CM

## 2024-01-23 MED ORDER — SERTRALINE HCL 50 MG PO TABS: 50.0000 mg | ORAL_TABLET | Freq: Every day | ORAL | 3 refills | Status: DC

## 2024-01-23 MED ORDER — TETANUS-DIPHTH-ACELL PERTUSSIS 5-2.5-18.5 LF-MCG/0.5 IM SUSY
0.5000 mL | PREFILLED_SYRINGE | Freq: Once | INTRAMUSCULAR | 0 refills | Status: AC
  Filled 2024-01-23: qty 0.5, 1d supply, fill #0

## 2024-01-23 NOTE — Patient Instructions (Signed)
 Take 2 tabs of the 25 mg sertraline  until the new dosage arrives in the mail.   Let us  know if you need anything.

## 2024-01-23 NOTE — Addendum Note (Signed)
 Addended by: Creed Dodrill on: 01/23/2024 12:58 PM   Modules accepted: Level of Service

## 2024-01-23 NOTE — Progress Notes (Signed)
 Chief Complaint  Patient presents with   Follow-up    Follow up    Subjective Sherri Parks presents for f/u depression. Here w spouse.   Pt is currently being treated with Zoloft  25 mg/d started 6 weeks ago.  Reports some improvement since treatment. No thoughts of harming self or others. No self-medication with alcohol, prescription drugs or illicit drugs. Pt is not following with a counselor/psychologist.  Past Medical History:  Diagnosis Date   Hypercholesteremia    Hypertension    Murmur, cardiac    Osteoporosis    Allergies as of 01/23/2024       Reactions   Amoxicillin Nausea And Vomiting        Medication List        Accurate as of January 23, 2024 12:57 PM. If you have any questions, ask your nurse or doctor.          cyanocobalamin  1000 MCG tablet Commonly known as: VITAMIN B12 Take 1,000 mcg by mouth daily.   D3 PO Take 1 tablet by mouth daily.   doxepin  10 MG capsule Commonly known as: SINEQUAN  Take 1 capsule (10 mg total) by mouth at bedtime.   fish oil-omega-3 fatty acids 1000 MG capsule Take 1 g by mouth daily.   Magnesium 200 MG Chew Chew by mouth.   PRESERVISION AREDS PO Take 1 tablet by mouth daily.   PROBIOTIC ADVANCED PO Take by mouth.   sertraline  50 MG tablet Commonly known as: ZOLOFT  Take 1 tablet (50 mg total) by mouth daily. What changed:  medication strength how much to take Changed by: Jobe Mulder   simvastatin  20 MG tablet Commonly known as: ZOCOR  Take 1 tablet (20 mg total) by mouth at bedtime.   Ubiquinol 100 MG Caps Take by mouth.   UNABLE TO FIND Take 1 tablet by mouth daily. Med Name: viactive calcium        Exam BP 122/80   Pulse 98   Temp 97.8 F (36.6 C) (Oral)   Resp 16   Ht 4\' 11"  (1.499 m)   Wt 86 lb (39 kg)   SpO2 98%   BMI 17.37 kg/m  General:  well developed, well nourished, in no apparent distress Lungs:  No respiratory distress Psych: Nml affect  Assessment and  Plan  Depression, major, single episode, in partial remission (HCC) - Plan: sertraline  (ZOLOFT ) 50 MG tablet  Chronic, not fully stable. Increase Zoloft  from 25 mg/d to 50 mg/d. Consider counseling.  F/u in 5 mo for CPE. The patient and her spouse voiced understanding and agreement to the plan.  Shellie Dials [place], DO 01/23/24 12:57 PM

## 2024-01-25 DIAGNOSIS — H5202 Hypermetropia, left eye: Secondary | ICD-10-CM | POA: Diagnosis not present

## 2024-01-25 DIAGNOSIS — Z961 Presence of intraocular lens: Secondary | ICD-10-CM | POA: Diagnosis not present

## 2024-01-25 DIAGNOSIS — H04123 Dry eye syndrome of bilateral lacrimal glands: Secondary | ICD-10-CM | POA: Diagnosis not present

## 2024-01-25 DIAGNOSIS — H52203 Unspecified astigmatism, bilateral: Secondary | ICD-10-CM | POA: Diagnosis not present

## 2024-01-25 DIAGNOSIS — H3554 Dystrophies primarily involving the retinal pigment epithelium: Secondary | ICD-10-CM | POA: Diagnosis not present

## 2024-01-25 DIAGNOSIS — H524 Presbyopia: Secondary | ICD-10-CM | POA: Diagnosis not present

## 2024-01-25 DIAGNOSIS — H35372 Puckering of macula, left eye: Secondary | ICD-10-CM | POA: Diagnosis not present

## 2024-01-25 DIAGNOSIS — H35363 Drusen (degenerative) of macula, bilateral: Secondary | ICD-10-CM | POA: Diagnosis not present

## 2024-01-25 DIAGNOSIS — H353132 Nonexudative age-related macular degeneration, bilateral, intermediate dry stage: Secondary | ICD-10-CM | POA: Diagnosis not present

## 2024-01-25 DIAGNOSIS — H43813 Vitreous degeneration, bilateral: Secondary | ICD-10-CM | POA: Diagnosis not present

## 2024-02-06 ENCOUNTER — Ambulatory Visit (INDEPENDENT_AMBULATORY_CARE_PROVIDER_SITE_OTHER): Payer: Medicare HMO | Admitting: Family Medicine

## 2024-02-06 ENCOUNTER — Encounter: Payer: Self-pay | Admitting: Family Medicine

## 2024-02-06 VITALS — BP 122/78 | HR 96 | Temp 98.0°F | Resp 16 | Ht 59.0 in | Wt 83.8 lb

## 2024-02-06 DIAGNOSIS — H6123 Impacted cerumen, bilateral: Secondary | ICD-10-CM | POA: Diagnosis not present

## 2024-02-06 NOTE — Patient Instructions (Signed)
 OK to use Debrox (peroxide) in the ear to loosen up wax. Also recommend using a bulb syringe (for removing boogers from baby's noses) to flush through warm water and vinegar (3-4:1 ratio). An alternative, though more expensive, is an elephant ear washer wax removal kit. Do not use Q-tips as this can impact wax further.  Let us know if you need anything.

## 2024-02-06 NOTE — Progress Notes (Signed)
 Chief Complaint  Patient presents with   Ear Fullness    Ear fullness    Pt is here for left ear fullness.  She is here with her spouse who helps with the history. Duration: 6 mo Progression: unchanged Associated symptoms: hearing loss on L Went for hearing aid eval and they said there was wax Denies: congestion and coryza, bleeding, or discharge from ear Treatment to date: flushing at home  Past Medical History:  Diagnosis Date   Hypercholesteremia    Hypertension    Murmur, cardiac    Osteoporosis     BP 122/78 (BP Location: Left Arm, Patient Position: Sitting)   Pulse 96   Temp 98 F (36.7 C) (Oral)   Resp 16   Ht 4\' 11"  (1.499 m)   Wt 83 lb 12.8 oz (38 kg)   SpO2 98%   BMI 16.93 kg/m  General: Awake, alert, appearing stated age HEENT:  L ear- Canal is 100% obstructed with cerumen R ear- canal is 100% obstructed with cerumen Lungs: Normal effort, no accessory muscle use Psych: Limited judgment and insight  Bilateral impacted cerumen  Irrigation today with successful removal of cerumen.  Patient reported improvement.  Home instructions provided in AVS.  Avoid Q-tips. F/u prn.  Pt and her spouse voiced understanding and agreement to the plan.  Jilda Roche [place]aran[place], DO 02/06/24 3:36 PM

## 2024-03-05 ENCOUNTER — Encounter: Payer: Self-pay | Admitting: Family Medicine

## 2024-03-05 ENCOUNTER — Telehealth: Payer: Self-pay

## 2024-03-05 ENCOUNTER — Ambulatory Visit (INDEPENDENT_AMBULATORY_CARE_PROVIDER_SITE_OTHER): Admitting: Family Medicine

## 2024-03-05 VITALS — BP 124/78 | HR 60 | Ht 59.0 in | Wt 85.2 lb

## 2024-03-05 DIAGNOSIS — R4189 Other symptoms and signs involving cognitive functions and awareness: Secondary | ICD-10-CM | POA: Diagnosis not present

## 2024-03-05 MED ORDER — DONEPEZIL HCL 5 MG PO TBDP: 5.0000 mg | ORAL_TABLET | Freq: Every day | ORAL | 0 refills | Status: DC

## 2024-03-05 NOTE — Telephone Encounter (Signed)
 Copied from CRM [id number]. Topic: General - Other >> Mar 05, 2024  4:16 PM Fredrich Romans wrote: Reason for CRM: Patients husband called in to ask provider about writing a letter for POA  ,on behalf of patient stating her condition of his wifes health. Patient is requesting that the letter be mailed to their home.Please call husband when mailed

## 2024-03-05 NOTE — Progress Notes (Signed)
 Chief Complaint  Patient presents with   Memory Loss    Patient presents today for memory loss. Husband reports memory is getting worse.    Subjective: Patient is a 78 y.o. female here for f/u.  Patient is here with her spouse.  Over the past several months, the patient's memory has worsened.  Since starting Zoloft 50 mg daily, which she is compliant with and reports no adverse effects, her mood has stabilized and she seems less anxious per her spouse.  She forgets some things more.  She saw neurology around a year ago and declined any medication.  The patient and her spouse are open to this now.  Past Medical History:  Diagnosis Date   Hypercholesteremia    Hypertension    Murmur, cardiac    Osteoporosis     Objective: BP 124/78   Pulse 60   Ht 4\' 11"  (1.499 m)   Wt 85 lb 3.2 oz (38.6 kg)   SpO2 97%   BMI 17.21 kg/m  General: Awake, appears stated age Lungs: No accessory muscle use Psych: Limited judgment/insight, normal affect  Assessment and Plan: Cognitive impairment - Plan: donepezil (ARICEPT ODT) 5 MG disintegrating tablet  Chronic, not stable.  Start Aricept 5 mg daily.  Follow-up in 30 days to recheck.  Neurology information provided in paperwork so they can follow-up.  Her husband also mentioned having the insurance company pay for grab bars in the shower.  Reaching out to them would be a good place to start.  He will let me know if there is anything I can do to help. The patient and her spouse voiced understanding and agreement to the plan.  Jilda Roche [place], DO 03/05/24  1:22 PM

## 2024-03-05 NOTE — Patient Instructions (Addendum)
 Please let me know if/how I can help with shower/grab bars.   Please reach out to the neurology team to follow up on the memory problems. [phone]   Let us know if you need anything.

## 2024-03-06 NOTE — Telephone Encounter (Signed)
 Called husband and no answer left vm to return call.

## 2024-03-12 DIAGNOSIS — M160 Bilateral primary osteoarthritis of hip: Secondary | ICD-10-CM | POA: Diagnosis not present

## 2024-03-12 DIAGNOSIS — Z79899 Other long term (current) drug therapy: Secondary | ICD-10-CM | POA: Diagnosis not present

## 2024-03-12 DIAGNOSIS — E538 Deficiency of other specified B group vitamins: Secondary | ICD-10-CM | POA: Diagnosis not present

## 2024-03-12 DIAGNOSIS — F0394 Unspecified dementia, unspecified severity, with anxiety: Secondary | ICD-10-CM | POA: Diagnosis not present

## 2024-03-12 DIAGNOSIS — Z8616 Personal history of COVID-19: Secondary | ICD-10-CM | POA: Diagnosis not present

## 2024-03-12 DIAGNOSIS — S72101D Unspecified trochanteric fracture of right femur, subsequent encounter for closed fracture with routine healing: Secondary | ICD-10-CM | POA: Diagnosis not present

## 2024-03-12 DIAGNOSIS — R278 Other lack of coordination: Secondary | ICD-10-CM | POA: Diagnosis not present

## 2024-03-12 DIAGNOSIS — M25551 Pain in right hip: Secondary | ICD-10-CM | POA: Diagnosis not present

## 2024-03-12 DIAGNOSIS — F419 Anxiety disorder, unspecified: Secondary | ICD-10-CM | POA: Diagnosis not present

## 2024-03-12 DIAGNOSIS — I10 Essential (primary) hypertension: Secondary | ICD-10-CM | POA: Diagnosis not present

## 2024-03-12 DIAGNOSIS — R4701 Aphasia: Secondary | ICD-10-CM | POA: Diagnosis not present

## 2024-03-12 DIAGNOSIS — S72009A Fracture of unspecified part of neck of unspecified femur, initial encounter for closed fracture: Secondary | ICD-10-CM | POA: Diagnosis not present

## 2024-03-12 DIAGNOSIS — I2510 Atherosclerotic heart disease of native coronary artery without angina pectoris: Secondary | ICD-10-CM | POA: Diagnosis not present

## 2024-03-12 DIAGNOSIS — Y92009 Unspecified place in unspecified non-institutional (private) residence as the place of occurrence of the external cause: Secondary | ICD-10-CM | POA: Diagnosis not present

## 2024-03-12 DIAGNOSIS — F32A Depression, unspecified: Secondary | ICD-10-CM | POA: Diagnosis not present

## 2024-03-12 DIAGNOSIS — W19XXXA Unspecified fall, initial encounter: Secondary | ICD-10-CM | POA: Diagnosis not present

## 2024-03-12 DIAGNOSIS — R41841 Cognitive communication deficit: Secondary | ICD-10-CM | POA: Diagnosis not present

## 2024-03-12 DIAGNOSIS — S72111A Displaced fracture of greater trochanter of right femur, initial encounter for closed fracture: Secondary | ICD-10-CM | POA: Diagnosis not present

## 2024-03-12 DIAGNOSIS — R262 Difficulty in walking, not elsewhere classified: Secondary | ICD-10-CM | POA: Diagnosis not present

## 2024-03-12 DIAGNOSIS — E782 Mixed hyperlipidemia: Secondary | ICD-10-CM | POA: Diagnosis not present

## 2024-03-12 DIAGNOSIS — Z043 Encounter for examination and observation following other accident: Secondary | ICD-10-CM | POA: Diagnosis not present

## 2024-03-12 DIAGNOSIS — E785 Hyperlipidemia, unspecified: Secondary | ICD-10-CM | POA: Diagnosis not present

## 2024-03-12 DIAGNOSIS — W010XXA Fall on same level from slipping, tripping and stumbling without subsequent striking against object, initial encounter: Secondary | ICD-10-CM | POA: Diagnosis not present

## 2024-03-12 DIAGNOSIS — Z881 Allergy status to other antibiotic agents status: Secondary | ICD-10-CM | POA: Diagnosis not present

## 2024-03-12 DIAGNOSIS — E7800 Pure hypercholesterolemia, unspecified: Secondary | ICD-10-CM | POA: Diagnosis not present

## 2024-03-12 DIAGNOSIS — E43 Unspecified severe protein-calorie malnutrition: Secondary | ICD-10-CM | POA: Diagnosis not present

## 2024-03-12 DIAGNOSIS — M6281 Muscle weakness (generalized): Secondary | ICD-10-CM | POA: Diagnosis not present

## 2024-03-12 DIAGNOSIS — Z860100 Personal history of colon polyps, unspecified: Secondary | ICD-10-CM | POA: Diagnosis not present

## 2024-03-12 DIAGNOSIS — Z743 Need for continuous supervision: Secondary | ICD-10-CM | POA: Diagnosis not present

## 2024-03-12 DIAGNOSIS — R531 Weakness: Secondary | ICD-10-CM | POA: Diagnosis not present

## 2024-03-12 DIAGNOSIS — F0393 Unspecified dementia, unspecified severity, with mood disturbance: Secondary | ICD-10-CM | POA: Diagnosis not present

## 2024-03-12 DIAGNOSIS — S72101A Unspecified trochanteric fracture of right femur, initial encounter for closed fracture: Secondary | ICD-10-CM | POA: Diagnosis not present

## 2024-03-12 DIAGNOSIS — M1611 Unilateral primary osteoarthritis, right hip: Secondary | ICD-10-CM | POA: Diagnosis not present

## 2024-03-12 DIAGNOSIS — Z681 Body mass index (BMI) 19 or less, adult: Secondary | ICD-10-CM | POA: Diagnosis not present

## 2024-03-12 DIAGNOSIS — R4182 Altered mental status, unspecified: Secondary | ICD-10-CM | POA: Diagnosis not present

## 2024-03-13 NOTE — Telephone Encounter (Signed)
 Per patient that is what he for like for it to say.  She is no coherent and unable to make decisions.  Pt is not doing well and they are trying to get affairs in order.  She cannot answer questions or comprehend anything.

## 2024-03-13 NOTE — Telephone Encounter (Signed)
 OK to write letter stating in my medical opinion, the patient it not able to reasonably make serious and important decisions by herself.

## 2024-03-14 ENCOUNTER — Ambulatory Visit: Attending: Family Medicine | Admitting: Physical Therapy

## 2024-03-14 DIAGNOSIS — S0033XA Contusion of nose, initial encounter: Secondary | ICD-10-CM | POA: Diagnosis not present

## 2024-03-14 DIAGNOSIS — Z743 Need for continuous supervision: Secondary | ICD-10-CM | POA: Diagnosis not present

## 2024-03-14 DIAGNOSIS — R531 Weakness: Secondary | ICD-10-CM | POA: Diagnosis not present

## 2024-03-14 DIAGNOSIS — F419 Anxiety disorder, unspecified: Secondary | ICD-10-CM | POA: Diagnosis not present

## 2024-03-14 DIAGNOSIS — M8580 Other specified disorders of bone density and structure, unspecified site: Secondary | ICD-10-CM | POA: Diagnosis not present

## 2024-03-14 DIAGNOSIS — I10 Essential (primary) hypertension: Secondary | ICD-10-CM | POA: Diagnosis not present

## 2024-03-14 DIAGNOSIS — W19XXXA Unspecified fall, initial encounter: Secondary | ICD-10-CM | POA: Diagnosis not present

## 2024-03-14 DIAGNOSIS — R262 Difficulty in walking, not elsewhere classified: Secondary | ICD-10-CM | POA: Diagnosis not present

## 2024-03-14 DIAGNOSIS — F411 Generalized anxiety disorder: Secondary | ICD-10-CM | POA: Diagnosis not present

## 2024-03-14 DIAGNOSIS — F32A Depression, unspecified: Secondary | ICD-10-CM | POA: Diagnosis not present

## 2024-03-14 DIAGNOSIS — R29898 Other symptoms and signs involving the musculoskeletal system: Secondary | ICD-10-CM | POA: Diagnosis not present

## 2024-03-14 DIAGNOSIS — S72111A Displaced fracture of greater trochanter of right femur, initial encounter for closed fracture: Secondary | ICD-10-CM | POA: Diagnosis not present

## 2024-03-14 DIAGNOSIS — R41841 Cognitive communication deficit: Secondary | ICD-10-CM | POA: Diagnosis not present

## 2024-03-14 DIAGNOSIS — R9082 White matter disease, unspecified: Secondary | ICD-10-CM | POA: Diagnosis not present

## 2024-03-14 DIAGNOSIS — R4701 Aphasia: Secondary | ICD-10-CM | POA: Diagnosis not present

## 2024-03-14 DIAGNOSIS — M6281 Muscle weakness (generalized): Secondary | ICD-10-CM | POA: Diagnosis not present

## 2024-03-14 DIAGNOSIS — E785 Hyperlipidemia, unspecified: Secondary | ICD-10-CM | POA: Diagnosis not present

## 2024-03-14 DIAGNOSIS — F320 Major depressive disorder, single episode, mild: Secondary | ICD-10-CM | POA: Diagnosis not present

## 2024-03-14 DIAGNOSIS — Z043 Encounter for examination and observation following other accident: Secondary | ICD-10-CM | POA: Diagnosis not present

## 2024-03-14 DIAGNOSIS — R278 Other lack of coordination: Secondary | ICD-10-CM | POA: Diagnosis not present

## 2024-03-14 DIAGNOSIS — R4182 Altered mental status, unspecified: Secondary | ICD-10-CM | POA: Diagnosis not present

## 2024-03-14 DIAGNOSIS — S72101D Unspecified trochanteric fracture of right femur, subsequent encounter for closed fracture with routine healing: Secondary | ICD-10-CM | POA: Diagnosis not present

## 2024-03-14 DIAGNOSIS — S022XXA Fracture of nasal bones, initial encounter for closed fracture: Secondary | ICD-10-CM | POA: Diagnosis not present

## 2024-03-14 DIAGNOSIS — E538 Deficiency of other specified B group vitamins: Secondary | ICD-10-CM | POA: Diagnosis not present

## 2024-03-15 ENCOUNTER — Encounter: Payer: Self-pay | Admitting: *Deleted

## 2024-03-15 DIAGNOSIS — S72101D Unspecified trochanteric fracture of right femur, subsequent encounter for closed fracture with routine healing: Secondary | ICD-10-CM | POA: Diagnosis not present

## 2024-03-15 NOTE — Telephone Encounter (Signed)
 Letter done just waiting on providers signature.

## 2024-03-16 NOTE — Telephone Encounter (Signed)
 Called husband to advise letter is ready to be picked up and no answer. If patient return call ok to advise patient letter is available for pick up.

## 2024-03-21 DIAGNOSIS — F419 Anxiety disorder, unspecified: Secondary | ICD-10-CM | POA: Diagnosis not present

## 2024-03-21 DIAGNOSIS — F32A Depression, unspecified: Secondary | ICD-10-CM | POA: Diagnosis not present

## 2024-03-21 DIAGNOSIS — S72101D Unspecified trochanteric fracture of right femur, subsequent encounter for closed fracture with routine healing: Secondary | ICD-10-CM | POA: Diagnosis not present

## 2024-03-21 DIAGNOSIS — E785 Hyperlipidemia, unspecified: Secondary | ICD-10-CM | POA: Diagnosis not present

## 2024-03-27 DIAGNOSIS — R9082 White matter disease, unspecified: Secondary | ICD-10-CM | POA: Diagnosis not present

## 2024-03-27 DIAGNOSIS — M8580 Other specified disorders of bone density and structure, unspecified site: Secondary | ICD-10-CM | POA: Diagnosis not present

## 2024-03-27 DIAGNOSIS — Z043 Encounter for examination and observation following other accident: Secondary | ICD-10-CM | POA: Diagnosis not present

## 2024-03-27 DIAGNOSIS — F411 Generalized anxiety disorder: Secondary | ICD-10-CM | POA: Diagnosis not present

## 2024-03-27 DIAGNOSIS — S022XXA Fracture of nasal bones, initial encounter for closed fracture: Secondary | ICD-10-CM | POA: Diagnosis not present

## 2024-03-27 DIAGNOSIS — F320 Major depressive disorder, single episode, mild: Secondary | ICD-10-CM | POA: Diagnosis not present

## 2024-03-27 DIAGNOSIS — S0033XA Contusion of nose, initial encounter: Secondary | ICD-10-CM | POA: Diagnosis not present

## 2024-03-28 DIAGNOSIS — Z743 Need for continuous supervision: Secondary | ICD-10-CM | POA: Diagnosis not present

## 2024-03-28 DIAGNOSIS — R531 Weakness: Secondary | ICD-10-CM | POA: Diagnosis not present

## 2024-03-28 DIAGNOSIS — W19XXXA Unspecified fall, initial encounter: Secondary | ICD-10-CM | POA: Diagnosis not present

## 2024-03-28 DIAGNOSIS — Z7401 Bed confinement status: Secondary | ICD-10-CM | POA: Diagnosis not present

## 2024-04-04 DIAGNOSIS — Z043 Encounter for examination and observation following other accident: Secondary | ICD-10-CM | POA: Diagnosis not present

## 2024-04-04 DIAGNOSIS — R040 Epistaxis: Secondary | ICD-10-CM | POA: Diagnosis not present

## 2024-04-04 DIAGNOSIS — M6281 Muscle weakness (generalized): Secondary | ICD-10-CM | POA: Diagnosis not present

## 2024-04-04 DIAGNOSIS — S72101D Unspecified trochanteric fracture of right femur, subsequent encounter for closed fracture with routine healing: Secondary | ICD-10-CM | POA: Diagnosis not present

## 2024-04-05 DIAGNOSIS — S72111A Displaced fracture of greater trochanter of right femur, initial encounter for closed fracture: Secondary | ICD-10-CM | POA: Diagnosis not present

## 2024-04-05 DIAGNOSIS — M8000XA Age-related osteoporosis with current pathological fracture, unspecified site, initial encounter for fracture: Secondary | ICD-10-CM | POA: Diagnosis not present

## 2024-04-06 DIAGNOSIS — F03C18 Unspecified dementia, severe, with other behavioral disturbance: Secondary | ICD-10-CM | POA: Diagnosis not present

## 2024-04-06 DIAGNOSIS — Z043 Encounter for examination and observation following other accident: Secondary | ICD-10-CM | POA: Diagnosis not present

## 2024-04-06 DIAGNOSIS — W19XXXA Unspecified fall, initial encounter: Secondary | ICD-10-CM | POA: Diagnosis not present

## 2024-04-09 DIAGNOSIS — E785 Hyperlipidemia, unspecified: Secondary | ICD-10-CM | POA: Diagnosis not present

## 2024-04-09 DIAGNOSIS — D519 Vitamin B12 deficiency anemia, unspecified: Secondary | ICD-10-CM | POA: Diagnosis not present

## 2024-04-10 DIAGNOSIS — R630 Anorexia: Secondary | ICD-10-CM | POA: Diagnosis not present

## 2024-04-10 DIAGNOSIS — R634 Abnormal weight loss: Secondary | ICD-10-CM | POA: Diagnosis not present

## 2024-04-12 DIAGNOSIS — E559 Vitamin D deficiency, unspecified: Secondary | ICD-10-CM | POA: Diagnosis not present

## 2024-04-18 DIAGNOSIS — F32A Depression, unspecified: Secondary | ICD-10-CM | POA: Diagnosis not present

## 2024-04-18 DIAGNOSIS — F419 Anxiety disorder, unspecified: Secondary | ICD-10-CM | POA: Diagnosis not present

## 2024-04-18 DIAGNOSIS — Z5181 Encounter for therapeutic drug level monitoring: Secondary | ICD-10-CM | POA: Diagnosis not present

## 2024-04-18 DIAGNOSIS — R41841 Cognitive communication deficit: Secondary | ICD-10-CM | POA: Diagnosis not present

## 2024-04-24 DIAGNOSIS — F320 Major depressive disorder, single episode, mild: Secondary | ICD-10-CM | POA: Diagnosis not present

## 2024-04-24 DIAGNOSIS — F411 Generalized anxiety disorder: Secondary | ICD-10-CM | POA: Diagnosis not present

## 2024-05-11 DIAGNOSIS — E43 Unspecified severe protein-calorie malnutrition: Secondary | ICD-10-CM | POA: Diagnosis not present

## 2024-05-11 DIAGNOSIS — E785 Hyperlipidemia, unspecified: Secondary | ICD-10-CM | POA: Diagnosis not present

## 2024-05-11 DIAGNOSIS — S72101D Unspecified trochanteric fracture of right femur, subsequent encounter for closed fracture with routine healing: Secondary | ICD-10-CM | POA: Diagnosis not present

## 2024-05-11 DIAGNOSIS — F03C4 Unspecified dementia, severe, with anxiety: Secondary | ICD-10-CM | POA: Diagnosis not present

## 2024-05-13 DIAGNOSIS — R41841 Cognitive communication deficit: Secondary | ICD-10-CM | POA: Diagnosis not present

## 2024-05-13 DIAGNOSIS — E785 Hyperlipidemia, unspecified: Secondary | ICD-10-CM | POA: Diagnosis not present

## 2024-05-13 DIAGNOSIS — F419 Anxiety disorder, unspecified: Secondary | ICD-10-CM | POA: Diagnosis not present

## 2024-05-13 DIAGNOSIS — S72101D Unspecified trochanteric fracture of right femur, subsequent encounter for closed fracture with routine healing: Secondary | ICD-10-CM | POA: Diagnosis not present

## 2024-05-13 DIAGNOSIS — F32A Depression, unspecified: Secondary | ICD-10-CM | POA: Diagnosis not present

## 2024-05-13 DIAGNOSIS — M6281 Muscle weakness (generalized): Secondary | ICD-10-CM | POA: Diagnosis not present

## 2024-05-13 DIAGNOSIS — R4182 Altered mental status, unspecified: Secondary | ICD-10-CM | POA: Diagnosis not present

## 2024-05-13 DIAGNOSIS — F03C4 Unspecified dementia, severe, with anxiety: Secondary | ICD-10-CM | POA: Diagnosis not present

## 2024-05-13 DIAGNOSIS — R4701 Aphasia: Secondary | ICD-10-CM | POA: Diagnosis not present

## 2024-05-13 DIAGNOSIS — R278 Other lack of coordination: Secondary | ICD-10-CM | POA: Diagnosis not present

## 2024-05-13 DIAGNOSIS — I10 Essential (primary) hypertension: Secondary | ICD-10-CM | POA: Diagnosis not present

## 2024-05-13 DIAGNOSIS — E538 Deficiency of other specified B group vitamins: Secondary | ICD-10-CM | POA: Diagnosis not present

## 2024-05-13 DIAGNOSIS — N39498 Other specified urinary incontinence: Secondary | ICD-10-CM | POA: Diagnosis not present

## 2024-05-13 DIAGNOSIS — R262 Difficulty in walking, not elsewhere classified: Secondary | ICD-10-CM | POA: Diagnosis not present

## 2024-05-18 DIAGNOSIS — N39498 Other specified urinary incontinence: Secondary | ICD-10-CM | POA: Diagnosis not present

## 2024-05-18 DIAGNOSIS — F03C4 Unspecified dementia, severe, with anxiety: Secondary | ICD-10-CM | POA: Diagnosis not present

## 2024-05-21 DIAGNOSIS — R4182 Altered mental status, unspecified: Secondary | ICD-10-CM | POA: Diagnosis not present

## 2024-05-21 DIAGNOSIS — E785 Hyperlipidemia, unspecified: Secondary | ICD-10-CM | POA: Diagnosis not present

## 2024-05-21 DIAGNOSIS — M6281 Muscle weakness (generalized): Secondary | ICD-10-CM | POA: Diagnosis not present

## 2024-05-21 DIAGNOSIS — R4701 Aphasia: Secondary | ICD-10-CM | POA: Diagnosis not present

## 2024-05-21 DIAGNOSIS — R41841 Cognitive communication deficit: Secondary | ICD-10-CM | POA: Diagnosis not present

## 2024-05-21 DIAGNOSIS — F32A Depression, unspecified: Secondary | ICD-10-CM | POA: Diagnosis not present

## 2024-05-21 DIAGNOSIS — R278 Other lack of coordination: Secondary | ICD-10-CM | POA: Diagnosis not present

## 2024-05-21 DIAGNOSIS — S72101D Unspecified trochanteric fracture of right femur, subsequent encounter for closed fracture with routine healing: Secondary | ICD-10-CM | POA: Diagnosis not present

## 2024-05-21 DIAGNOSIS — R262 Difficulty in walking, not elsewhere classified: Secondary | ICD-10-CM | POA: Diagnosis not present

## 2024-05-21 DIAGNOSIS — E538 Deficiency of other specified B group vitamins: Secondary | ICD-10-CM | POA: Diagnosis not present

## 2024-05-21 DIAGNOSIS — I10 Essential (primary) hypertension: Secondary | ICD-10-CM | POA: Diagnosis not present

## 2024-05-21 DIAGNOSIS — F419 Anxiety disorder, unspecified: Secondary | ICD-10-CM | POA: Diagnosis not present

## 2024-05-22 DIAGNOSIS — M6281 Muscle weakness (generalized): Secondary | ICD-10-CM | POA: Diagnosis not present

## 2024-05-22 DIAGNOSIS — I10 Essential (primary) hypertension: Secondary | ICD-10-CM | POA: Diagnosis not present

## 2024-05-22 DIAGNOSIS — E785 Hyperlipidemia, unspecified: Secondary | ICD-10-CM | POA: Diagnosis not present

## 2024-05-22 DIAGNOSIS — F320 Major depressive disorder, single episode, mild: Secondary | ICD-10-CM | POA: Diagnosis not present

## 2024-05-22 DIAGNOSIS — S72101D Unspecified trochanteric fracture of right femur, subsequent encounter for closed fracture with routine healing: Secondary | ICD-10-CM | POA: Diagnosis not present

## 2024-05-22 DIAGNOSIS — F419 Anxiety disorder, unspecified: Secondary | ICD-10-CM | POA: Diagnosis not present

## 2024-05-22 DIAGNOSIS — R41841 Cognitive communication deficit: Secondary | ICD-10-CM | POA: Diagnosis not present

## 2024-05-22 DIAGNOSIS — R262 Difficulty in walking, not elsewhere classified: Secondary | ICD-10-CM | POA: Diagnosis not present

## 2024-05-22 DIAGNOSIS — E538 Deficiency of other specified B group vitamins: Secondary | ICD-10-CM | POA: Diagnosis not present

## 2024-05-22 DIAGNOSIS — F411 Generalized anxiety disorder: Secondary | ICD-10-CM | POA: Diagnosis not present

## 2024-05-22 DIAGNOSIS — R4701 Aphasia: Secondary | ICD-10-CM | POA: Diagnosis not present

## 2024-05-22 DIAGNOSIS — R4182 Altered mental status, unspecified: Secondary | ICD-10-CM | POA: Diagnosis not present

## 2024-05-22 DIAGNOSIS — F32A Depression, unspecified: Secondary | ICD-10-CM | POA: Diagnosis not present

## 2024-05-22 DIAGNOSIS — R278 Other lack of coordination: Secondary | ICD-10-CM | POA: Diagnosis not present

## 2024-05-24 DIAGNOSIS — F03C4 Unspecified dementia, severe, with anxiety: Secondary | ICD-10-CM | POA: Diagnosis not present

## 2024-05-28 DIAGNOSIS — F03C11 Unspecified dementia, severe, with agitation: Secondary | ICD-10-CM | POA: Diagnosis not present

## 2024-06-04 DIAGNOSIS — R451 Restlessness and agitation: Secondary | ICD-10-CM | POA: Diagnosis not present

## 2024-06-04 DIAGNOSIS — F29 Unspecified psychosis not due to a substance or known physiological condition: Secondary | ICD-10-CM | POA: Diagnosis not present

## 2024-06-04 DIAGNOSIS — F32A Depression, unspecified: Secondary | ICD-10-CM | POA: Diagnosis not present

## 2024-06-04 DIAGNOSIS — F419 Anxiety disorder, unspecified: Secondary | ICD-10-CM | POA: Diagnosis not present

## 2024-06-14 DIAGNOSIS — F419 Anxiety disorder, unspecified: Secondary | ICD-10-CM | POA: Diagnosis not present

## 2024-06-14 DIAGNOSIS — F03C18 Unspecified dementia, severe, with other behavioral disturbance: Secondary | ICD-10-CM | POA: Diagnosis not present

## 2024-06-14 DIAGNOSIS — R262 Difficulty in walking, not elsewhere classified: Secondary | ICD-10-CM | POA: Diagnosis not present

## 2024-06-14 DIAGNOSIS — S72101D Unspecified trochanteric fracture of right femur, subsequent encounter for closed fracture with routine healing: Secondary | ICD-10-CM | POA: Diagnosis not present

## 2024-06-18 DIAGNOSIS — R262 Difficulty in walking, not elsewhere classified: Secondary | ICD-10-CM | POA: Diagnosis not present

## 2024-06-18 DIAGNOSIS — S72101D Unspecified trochanteric fracture of right femur, subsequent encounter for closed fracture with routine healing: Secondary | ICD-10-CM | POA: Diagnosis not present

## 2024-06-18 DIAGNOSIS — R41841 Cognitive communication deficit: Secondary | ICD-10-CM | POA: Diagnosis not present

## 2024-06-18 DIAGNOSIS — S022XXA Fracture of nasal bones, initial encounter for closed fracture: Secondary | ICD-10-CM | POA: Diagnosis not present

## 2024-06-19 ENCOUNTER — Telehealth: Payer: Self-pay

## 2024-06-19 NOTE — Transitions of Care (Post Inpatient/ED Visit) (Unsigned)
   06/19/2024  Name: Sherri Parks MRN: [id number] DOB: [birth year]  Today's TOC FU Call Status: Today's TOC FU Call Status:: Unsuccessful Call (1st Attempt) Unsuccessful Call (1st Attempt) Date: 06/19/24  Attempted to reach the patient regarding the most recent Inpatient/ED visit.  Follow Up Plan: Additional outreach attempts will be made to reach the patient to complete the Transitions of Care (Post Inpatient/ED visit) call.   Signature Julian Lemmings, LPN [facility] Nurse Health Advisor Direct Dial [phone]

## 2024-06-20 NOTE — Transitions of Care (Post Inpatient/ED Visit) (Signed)
   06/20/2024  Name: Sherri Parks MRN: [id number] DOB: 11-09-1946  Today's TOC FU Call Status: Today's TOC FU Call Status:: Successful TOC FU Call Completed Unsuccessful Call (1st Attempt) Date: 06/19/24 [facility] FU Call Complete Date: 06/20/24 Patient's Name and Date of Birth confirmed.  Transition Care Management Follow-up Telephone Call Date of Discharge: 06/18/24 Discharge Facility: Other [facility]) Name of Other ([facility]) Discharge Facility: [facility] Type of Discharge: Inpatient Admission Primary Inpatient Discharge Diagnosis:: weakness How have you been since you were released from the hospital?: Better Any questions or concerns?: No  Items Reviewed: Did you receive and understand the discharge instructions provided?: Yes Medications obtained,verified, and reconciled?: Yes (Medications Reviewed) Any new allergies since your discharge?: No Dietary orders reviewed?: Yes Do you have support at home?: Yes People in Home [RPT]: spouse  Medications Reviewed Today: Medications Reviewed Today     Reviewed by Emmitt Pan, LPN (Licensed Practical Nurse) on 06/20/24 at 1528  Med List Status: <None>   Medication Order Taking? Sig Documenting Provider Last Dose Status Informant  Cholecalciferol (D3 PO) [id number] Yes Take 1 tablet by mouth daily. [provider]  Active   donepezil  (ARICEPT  ODT) 5 MG disintegrating tablet [id number] Yes Take 1 tablet (5 mg total) by mouth at bedtime. Frann Mabel Mt, DO  Active   doxepin  (SINEQUAN ) 10 MG capsule [id number] Yes Take 1 capsule (10 mg total) by mouth at bedtime. Frann Mabel Mt, DO  Active   fish oil-omega-3 fatty acids 1000 MG capsule [id number] Yes Take 1 g by mouth daily. [provider]  Active Multiple Informants  Magnesium 200 MG CHEW [id number] Yes Chew by mouth. [provider]  Active   Multiple Vitamins-Minerals (PRESERVISION AREDS PO) [id number] Yes Take 1 tablet by mouth daily.  [provider]  Active   Probiotic Product (PROBIOTIC ADVANCED PO) [id number] Yes Take by mouth. [provider]  Active   sertraline  (ZOLOFT ) 50 MG tablet [id number] Yes Take 1 tablet (50 mg total) by mouth daily. Frann Mabel Mt, DO  Active   simvastatin  (ZOCOR ) 20 MG tablet [id number] Yes Take 1 tablet (20 mg total) by mouth at bedtime. Frann Mabel Mt, DO  Active   Ubiquinol 100 MG CAPS [id number] Yes Take by mouth. [provider]  Active   UNABLE TO FIND [id number] Yes Take 1 tablet by mouth daily. Med Name: viactive calcium [provider]  Active   vitamin B-12 (CYANOCOBALAMIN ) 1000 MCG tablet [id number] Yes Take 1,000 mcg by mouth daily. [provider]  Active             Home Care and Equipment/Supplies: Were Home Health Services Ordered?: NA Any new equipment or medical supplies ordered?: NA  Functional Questionnaire: Do you need assistance with bathing/showering or dressing?: Yes Do you need assistance with meal preparation?: Yes Do you need assistance with eating?: No Do you have difficulty maintaining continence: Yes Do you need assistance with getting out of bed/getting out of a chair/moving?: Yes Do you have difficulty managing or taking your medications?: Yes  Follow up appointments reviewed: PCP Follow-up appointment confirmed?: Yes Date of PCP follow-up appointment?: 06/26/24 Follow-up Provider: [facility] Follow-up appointment confirmed?: NA Do you need transportation to your follow-up appointment?: No Do you understand care options if your condition(s) worsen?: Yes-patient verbalized understanding    SIGNATURE Pan Emmitt, LPN [facility] Nurse Health Advisor Direct Dial [phone]

## 2024-06-22 ENCOUNTER — Encounter: Payer: Medicare HMO | Admitting: Family Medicine

## 2024-06-26 ENCOUNTER — Ambulatory Visit (INDEPENDENT_AMBULATORY_CARE_PROVIDER_SITE_OTHER): Admitting: Family Medicine

## 2024-06-26 ENCOUNTER — Other Ambulatory Visit: Payer: Self-pay

## 2024-06-26 ENCOUNTER — Encounter: Payer: Self-pay | Admitting: Family Medicine

## 2024-06-26 ENCOUNTER — Telehealth: Payer: Self-pay

## 2024-06-26 VITALS — BP 126/68 | HR 77 | Temp 97.5°F | Resp 16 | Ht 59.0 in | Wt 81.0 lb

## 2024-06-26 DIAGNOSIS — Y92009 Unspecified place in unspecified non-institutional (private) residence as the place of occurrence of the external cause: Secondary | ICD-10-CM | POA: Diagnosis not present

## 2024-06-26 DIAGNOSIS — S069X9A Unspecified intracranial injury with loss of consciousness of unspecified duration, initial encounter: Secondary | ICD-10-CM | POA: Diagnosis not present

## 2024-06-26 DIAGNOSIS — F339 Major depressive disorder, recurrent, unspecified: Secondary | ICD-10-CM

## 2024-06-26 DIAGNOSIS — G4700 Insomnia, unspecified: Secondary | ICD-10-CM

## 2024-06-26 DIAGNOSIS — Y9301 Activity, walking, marching and hiking: Secondary | ICD-10-CM | POA: Diagnosis not present

## 2024-06-26 DIAGNOSIS — F411 Generalized anxiety disorder: Secondary | ICD-10-CM | POA: Diagnosis not present

## 2024-06-26 DIAGNOSIS — R4189 Other symptoms and signs involving cognitive functions and awareness: Secondary | ICD-10-CM | POA: Diagnosis not present

## 2024-06-26 DIAGNOSIS — S52615A Nondisplaced fracture of left ulna styloid process, initial encounter for closed fracture: Secondary | ICD-10-CM | POA: Diagnosis not present

## 2024-06-26 DIAGNOSIS — Z043 Encounter for examination and observation following other accident: Secondary | ICD-10-CM | POA: Diagnosis not present

## 2024-06-26 DIAGNOSIS — S52572A Other intraarticular fracture of lower end of left radius, initial encounter for closed fracture: Secondary | ICD-10-CM | POA: Diagnosis not present

## 2024-06-26 DIAGNOSIS — M25432 Effusion, left wrist: Secondary | ICD-10-CM | POA: Diagnosis not present

## 2024-06-26 DIAGNOSIS — G44309 Post-traumatic headache, unspecified, not intractable: Secondary | ICD-10-CM | POA: Diagnosis not present

## 2024-06-26 DIAGNOSIS — R410 Disorientation, unspecified: Secondary | ICD-10-CM | POA: Diagnosis not present

## 2024-06-26 DIAGNOSIS — S066X0A Traumatic subarachnoid hemorrhage without loss of consciousness, initial encounter: Secondary | ICD-10-CM | POA: Diagnosis not present

## 2024-06-26 DIAGNOSIS — S52602A Unspecified fracture of lower end of left ulna, initial encounter for closed fracture: Secondary | ICD-10-CM | POA: Diagnosis not present

## 2024-06-26 DIAGNOSIS — M542 Cervicalgia: Secondary | ICD-10-CM | POA: Diagnosis not present

## 2024-06-26 DIAGNOSIS — M79632 Pain in left forearm: Secondary | ICD-10-CM | POA: Diagnosis not present

## 2024-06-26 DIAGNOSIS — W01198A Fall on same level from slipping, tripping and stumbling with subsequent striking against other object, initial encounter: Secondary | ICD-10-CM | POA: Diagnosis not present

## 2024-06-26 DIAGNOSIS — M85822 Other specified disorders of bone density and structure, left upper arm: Secondary | ICD-10-CM | POA: Diagnosis not present

## 2024-06-26 DIAGNOSIS — S022XXA Fracture of nasal bones, initial encounter for closed fracture: Secondary | ICD-10-CM | POA: Diagnosis not present

## 2024-06-26 DIAGNOSIS — S066X9A Traumatic subarachnoid hemorrhage with loss of consciousness of unspecified duration, initial encounter: Secondary | ICD-10-CM | POA: Diagnosis not present

## 2024-06-26 DIAGNOSIS — S59912A Unspecified injury of left forearm, initial encounter: Secondary | ICD-10-CM | POA: Diagnosis not present

## 2024-06-26 DIAGNOSIS — F03C0 Unspecified dementia, severe, without behavioral disturbance, psychotic disturbance, mood disturbance, and anxiety: Secondary | ICD-10-CM | POA: Diagnosis not present

## 2024-06-26 DIAGNOSIS — M8589 Other specified disorders of bone density and structure, multiple sites: Secondary | ICD-10-CM | POA: Diagnosis not present

## 2024-06-26 LAB — LAB REPORT - SCANNED: EGFR (Non-African Amer.): 88

## 2024-06-26 MED ORDER — DOXEPIN HCL 10 MG PO CAPS: 10.0000 mg | ORAL_CAPSULE | Freq: Every day | ORAL | 3 refills | Status: DC

## 2024-06-26 MED ORDER — SERTRALINE HCL 100 MG PO TABS: 100.0000 mg | ORAL_TABLET | Freq: Every day | ORAL | 3 refills | Status: DC

## 2024-06-26 MED ORDER — PROPRANOLOL HCL 10 MG PO TABS
ORAL_TABLET | ORAL | 1 refills | Status: DC
Start: 2024-06-26 — End: 2024-08-15

## 2024-06-26 MED ORDER — DONEPEZIL HCL 5 MG PO TBDP: 5.0000 mg | ORAL_TABLET | Freq: Every day | ORAL | 1 refills | Status: DC

## 2024-06-26 NOTE — Patient Instructions (Signed)
 Aim to do some physical exertion for 150 minutes per week. This is typically divided into 5 days per week, 30 minutes per day. The activity should be enough to get your heart rate up. Anything is better than nothing if you have time constraints.  Let us know if you need anything.
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
## 2024-06-26 NOTE — Telephone Encounter (Signed)
 Refill sent.

## 2024-06-26 NOTE — Telephone Encounter (Signed)
 Copied from CRM [id number]. Topic: Clinical - Prescription Issue >> Jun 26, 2024 12:11 PM Jasmin G wrote: Reason for CRM: Pt. Husband called because he states that his wife's medications should've been sent in to his home address instead of [facility]. Please contact pt. Back ASAP.

## 2024-06-27 ENCOUNTER — Other Ambulatory Visit (HOSPITAL_COMMUNITY): Payer: Self-pay

## 2024-06-27 ENCOUNTER — Telehealth: Payer: Self-pay

## 2024-06-27 DIAGNOSIS — I10 Essential (primary) hypertension: Secondary | ICD-10-CM | POA: Diagnosis not present

## 2024-06-27 DIAGNOSIS — E785 Hyperlipidemia, unspecified: Secondary | ICD-10-CM | POA: Diagnosis not present

## 2024-06-27 DIAGNOSIS — S066X0S Traumatic subarachnoid hemorrhage without loss of consciousness, sequela: Secondary | ICD-10-CM | POA: Diagnosis not present

## 2024-06-27 DIAGNOSIS — R531 Weakness: Secondary | ICD-10-CM | POA: Diagnosis not present

## 2024-06-27 DIAGNOSIS — F418 Other specified anxiety disorders: Secondary | ICD-10-CM | POA: Diagnosis not present

## 2024-06-27 DIAGNOSIS — R636 Underweight: Secondary | ICD-10-CM | POA: Diagnosis not present

## 2024-06-27 DIAGNOSIS — S066X9A Traumatic subarachnoid hemorrhage with loss of consciousness of unspecified duration, initial encounter: Secondary | ICD-10-CM | POA: Diagnosis not present

## 2024-06-27 DIAGNOSIS — S52615A Nondisplaced fracture of left ulna styloid process, initial encounter for closed fracture: Secondary | ICD-10-CM | POA: Diagnosis not present

## 2024-06-27 DIAGNOSIS — S52502A Unspecified fracture of the lower end of left radius, initial encounter for closed fracture: Secondary | ICD-10-CM | POA: Diagnosis not present

## 2024-06-27 DIAGNOSIS — I2510 Atherosclerotic heart disease of native coronary artery without angina pectoris: Secondary | ICD-10-CM | POA: Diagnosis not present

## 2024-06-27 DIAGNOSIS — G4700 Insomnia, unspecified: Secondary | ICD-10-CM | POA: Diagnosis not present

## 2024-06-27 DIAGNOSIS — R1310 Dysphagia, unspecified: Secondary | ICD-10-CM | POA: Diagnosis not present

## 2024-06-27 DIAGNOSIS — Y92009 Unspecified place in unspecified non-institutional (private) residence as the place of occurrence of the external cause: Secondary | ICD-10-CM | POA: Diagnosis not present

## 2024-06-27 DIAGNOSIS — F32A Depression, unspecified: Secondary | ICD-10-CM | POA: Diagnosis not present

## 2024-06-27 DIAGNOSIS — S52202A Unspecified fracture of shaft of left ulna, initial encounter for closed fracture: Secondary | ICD-10-CM | POA: Diagnosis not present

## 2024-06-27 DIAGNOSIS — S52602A Unspecified fracture of lower end of left ulna, initial encounter for closed fracture: Secondary | ICD-10-CM | POA: Diagnosis not present

## 2024-06-27 DIAGNOSIS — R279 Unspecified lack of coordination: Secondary | ICD-10-CM | POA: Diagnosis not present

## 2024-06-27 DIAGNOSIS — Z72820 Sleep deprivation: Secondary | ICD-10-CM | POA: Diagnosis not present

## 2024-06-27 DIAGNOSIS — K5900 Constipation, unspecified: Secondary | ICD-10-CM | POA: Diagnosis not present

## 2024-06-27 DIAGNOSIS — I609 Nontraumatic subarachnoid hemorrhage, unspecified: Secondary | ICD-10-CM | POA: Diagnosis not present

## 2024-06-27 DIAGNOSIS — G459 Transient cerebral ischemic attack, unspecified: Secondary | ICD-10-CM | POA: Diagnosis not present

## 2024-06-27 DIAGNOSIS — R2689 Other abnormalities of gait and mobility: Secondary | ICD-10-CM | POA: Diagnosis not present

## 2024-06-27 DIAGNOSIS — S022XXA Fracture of nasal bones, initial encounter for closed fracture: Secondary | ICD-10-CM | POA: Diagnosis not present

## 2024-06-27 DIAGNOSIS — S72111A Displaced fracture of greater trochanter of right femur, initial encounter for closed fracture: Secondary | ICD-10-CM | POA: Diagnosis not present

## 2024-06-27 DIAGNOSIS — M810 Age-related osteoporosis without current pathological fracture: Secondary | ICD-10-CM | POA: Diagnosis not present

## 2024-06-27 DIAGNOSIS — M546 Pain in thoracic spine: Secondary | ICD-10-CM | POA: Diagnosis not present

## 2024-06-27 DIAGNOSIS — S15092A Other specified injury of left carotid artery, initial encounter: Secondary | ICD-10-CM | POA: Diagnosis not present

## 2024-06-27 DIAGNOSIS — D62 Acute posthemorrhagic anemia: Secondary | ICD-10-CM | POA: Diagnosis not present

## 2024-06-27 DIAGNOSIS — S066XAA Traumatic subarachnoid hemorrhage with loss of consciousness status unknown, initial encounter: Secondary | ICD-10-CM | POA: Diagnosis not present

## 2024-06-27 DIAGNOSIS — S72111D Displaced fracture of greater trochanter of right femur, subsequent encounter for closed fracture with routine healing: Secondary | ICD-10-CM | POA: Diagnosis not present

## 2024-06-27 DIAGNOSIS — F03B0 Unspecified dementia, moderate, without behavioral disturbance, psychotic disturbance, mood disturbance, and anxiety: Secondary | ICD-10-CM | POA: Diagnosis not present

## 2024-06-27 DIAGNOSIS — S15001A Unspecified injury of right carotid artery, initial encounter: Secondary | ICD-10-CM | POA: Diagnosis not present

## 2024-06-27 DIAGNOSIS — M5459 Other low back pain: Secondary | ICD-10-CM | POA: Diagnosis not present

## 2024-06-27 DIAGNOSIS — E538 Deficiency of other specified B group vitamins: Secondary | ICD-10-CM | POA: Diagnosis not present

## 2024-06-27 DIAGNOSIS — Z743 Need for continuous supervision: Secondary | ICD-10-CM | POA: Diagnosis not present

## 2024-06-27 DIAGNOSIS — M6281 Muscle weakness (generalized): Secondary | ICD-10-CM | POA: Diagnosis not present

## 2024-06-27 DIAGNOSIS — E7800 Pure hypercholesterolemia, unspecified: Secondary | ICD-10-CM | POA: Diagnosis not present

## 2024-06-27 DIAGNOSIS — S15002A Unspecified injury of left carotid artery, initial encounter: Secondary | ICD-10-CM | POA: Diagnosis not present

## 2024-06-27 DIAGNOSIS — Z860100 Personal history of colon polyps, unspecified: Secondary | ICD-10-CM | POA: Diagnosis not present

## 2024-06-27 DIAGNOSIS — Z681 Body mass index (BMI) 19 or less, adult: Secondary | ICD-10-CM | POA: Diagnosis not present

## 2024-06-27 DIAGNOSIS — R0689 Other abnormalities of breathing: Secondary | ICD-10-CM | POA: Diagnosis not present

## 2024-06-27 DIAGNOSIS — F0393 Unspecified dementia, unspecified severity, with mood disturbance: Secondary | ICD-10-CM | POA: Diagnosis not present

## 2024-06-27 DIAGNOSIS — F0390 Unspecified dementia without behavioral disturbance: Secondary | ICD-10-CM | POA: Diagnosis not present

## 2024-06-27 DIAGNOSIS — H3530 Unspecified macular degeneration: Secondary | ICD-10-CM | POA: Diagnosis not present

## 2024-06-27 DIAGNOSIS — F0394 Unspecified dementia, unspecified severity, with anxiety: Secondary | ICD-10-CM | POA: Diagnosis not present

## 2024-06-27 DIAGNOSIS — W1830XA Fall on same level, unspecified, initial encounter: Secondary | ICD-10-CM | POA: Diagnosis not present

## 2024-06-27 NOTE — Telephone Encounter (Signed)
 Pharmacy Patient Advocate Encounter   Received notification from CoverMyMeds that prior authorization for Doxepin  HCl 10MG  capsules  is required/requested.   Insurance verification completed.   The patient is insured through [facility] .   Per test claim: PA required; PA started via CoverMyMeds. KEY [id number] . Waiting for clinical questions to populate.

## 2024-06-28 DIAGNOSIS — S52202A Unspecified fracture of shaft of left ulna, initial encounter for closed fracture: Secondary | ICD-10-CM | POA: Diagnosis not present

## 2024-06-28 DIAGNOSIS — E785 Hyperlipidemia, unspecified: Secondary | ICD-10-CM | POA: Diagnosis not present

## 2024-06-28 DIAGNOSIS — D62 Acute posthemorrhagic anemia: Secondary | ICD-10-CM | POA: Diagnosis not present

## 2024-06-28 DIAGNOSIS — S066XAA Traumatic subarachnoid hemorrhage with loss of consciousness status unknown, initial encounter: Secondary | ICD-10-CM | POA: Diagnosis not present

## 2024-06-28 DIAGNOSIS — R0689 Other abnormalities of breathing: Secondary | ICD-10-CM | POA: Diagnosis not present

## 2024-06-28 DIAGNOSIS — S066X9A Traumatic subarachnoid hemorrhage with loss of consciousness of unspecified duration, initial encounter: Secondary | ICD-10-CM | POA: Diagnosis not present

## 2024-06-28 DIAGNOSIS — S52502A Unspecified fracture of the lower end of left radius, initial encounter for closed fracture: Secondary | ICD-10-CM | POA: Diagnosis not present

## 2024-06-28 DIAGNOSIS — G4700 Insomnia, unspecified: Secondary | ICD-10-CM | POA: Diagnosis not present

## 2024-06-28 DIAGNOSIS — F418 Other specified anxiety disorders: Secondary | ICD-10-CM | POA: Diagnosis not present

## 2024-06-28 DIAGNOSIS — F0390 Unspecified dementia without behavioral disturbance: Secondary | ICD-10-CM | POA: Diagnosis not present

## 2024-06-28 NOTE — Telephone Encounter (Signed)
 PLEASE BE ADVISED Clinical questions have been answered and PA submitted.TO PLAN. PA currently Pending.

## 2024-06-29 DIAGNOSIS — M1812 Unilateral primary osteoarthritis of first carpometacarpal joint, left hand: Secondary | ICD-10-CM | POA: Diagnosis not present

## 2024-06-29 DIAGNOSIS — G459 Transient cerebral ischemic attack, unspecified: Secondary | ICD-10-CM | POA: Diagnosis not present

## 2024-06-29 DIAGNOSIS — S52502A Unspecified fracture of the lower end of left radius, initial encounter for closed fracture: Secondary | ICD-10-CM | POA: Diagnosis not present

## 2024-06-29 DIAGNOSIS — I610 Nontraumatic intracerebral hemorrhage in hemisphere, subcortical: Secondary | ICD-10-CM | POA: Diagnosis not present

## 2024-06-29 DIAGNOSIS — Z961 Presence of intraocular lens: Secondary | ICD-10-CM | POA: Diagnosis not present

## 2024-06-29 DIAGNOSIS — F03B4 Unspecified dementia, moderate, with anxiety: Secondary | ICD-10-CM | POA: Diagnosis not present

## 2024-06-29 DIAGNOSIS — S72111A Displaced fracture of greater trochanter of right femur, initial encounter for closed fracture: Secondary | ICD-10-CM | POA: Diagnosis not present

## 2024-06-29 DIAGNOSIS — I680 Cerebral amyloid angiopathy: Secondary | ICD-10-CM | POA: Diagnosis not present

## 2024-06-29 DIAGNOSIS — G4700 Insomnia, unspecified: Secondary | ICD-10-CM | POA: Diagnosis not present

## 2024-06-29 DIAGNOSIS — G9341 Metabolic encephalopathy: Secondary | ICD-10-CM | POA: Diagnosis not present

## 2024-06-29 DIAGNOSIS — M6281 Muscle weakness (generalized): Secondary | ICD-10-CM | POA: Diagnosis not present

## 2024-06-29 DIAGNOSIS — R2689 Other abnormalities of gait and mobility: Secondary | ICD-10-CM | POA: Diagnosis not present

## 2024-06-29 DIAGNOSIS — Z9841 Cataract extraction status, right eye: Secondary | ICD-10-CM | POA: Diagnosis not present

## 2024-06-29 DIAGNOSIS — Z515 Encounter for palliative care: Secondary | ICD-10-CM | POA: Diagnosis not present

## 2024-06-29 DIAGNOSIS — R0689 Other abnormalities of breathing: Secondary | ICD-10-CM | POA: Diagnosis not present

## 2024-06-29 DIAGNOSIS — F418 Other specified anxiety disorders: Secondary | ICD-10-CM | POA: Diagnosis not present

## 2024-06-29 DIAGNOSIS — R279 Unspecified lack of coordination: Secondary | ICD-10-CM | POA: Diagnosis not present

## 2024-06-29 DIAGNOSIS — F331 Major depressive disorder, recurrent, moderate: Secondary | ICD-10-CM | POA: Diagnosis not present

## 2024-06-29 DIAGNOSIS — Z452 Encounter for adjustment and management of vascular access device: Secondary | ICD-10-CM | POA: Diagnosis not present

## 2024-06-29 DIAGNOSIS — E7800 Pure hypercholesterolemia, unspecified: Secondary | ICD-10-CM | POA: Diagnosis not present

## 2024-06-29 DIAGNOSIS — I2510 Atherosclerotic heart disease of native coronary artery without angina pectoris: Secondary | ICD-10-CM | POA: Diagnosis not present

## 2024-06-29 DIAGNOSIS — S52202A Unspecified fracture of shaft of left ulna, initial encounter for closed fracture: Secondary | ICD-10-CM | POA: Diagnosis not present

## 2024-06-29 DIAGNOSIS — I10 Essential (primary) hypertension: Secondary | ICD-10-CM | POA: Diagnosis not present

## 2024-06-29 DIAGNOSIS — I619 Nontraumatic intracerebral hemorrhage, unspecified: Secondary | ICD-10-CM | POA: Diagnosis not present

## 2024-06-29 DIAGNOSIS — R531 Weakness: Secondary | ICD-10-CM | POA: Diagnosis not present

## 2024-06-29 DIAGNOSIS — S022XXA Fracture of nasal bones, initial encounter for closed fracture: Secondary | ICD-10-CM | POA: Diagnosis not present

## 2024-06-29 DIAGNOSIS — S52502D Unspecified fracture of the lower end of left radius, subsequent encounter for closed fracture with routine healing: Secondary | ICD-10-CM | POA: Diagnosis not present

## 2024-06-29 DIAGNOSIS — F0390 Unspecified dementia without behavioral disturbance: Secondary | ICD-10-CM | POA: Diagnosis not present

## 2024-06-29 DIAGNOSIS — G935 Compression of brain: Secondary | ICD-10-CM | POA: Diagnosis not present

## 2024-06-29 DIAGNOSIS — S066X0S Traumatic subarachnoid hemorrhage without loss of consciousness, sequela: Secondary | ICD-10-CM | POA: Diagnosis not present

## 2024-06-29 DIAGNOSIS — R262 Difficulty in walking, not elsewhere classified: Secondary | ICD-10-CM | POA: Diagnosis not present

## 2024-06-29 DIAGNOSIS — J9601 Acute respiratory failure with hypoxia: Secondary | ICD-10-CM | POA: Diagnosis not present

## 2024-06-29 DIAGNOSIS — M8588 Other specified disorders of bone density and structure, other site: Secondary | ICD-10-CM | POA: Diagnosis not present

## 2024-06-29 DIAGNOSIS — S72111D Displaced fracture of greater trochanter of right femur, subsequent encounter for closed fracture with routine healing: Secondary | ICD-10-CM | POA: Diagnosis not present

## 2024-06-29 DIAGNOSIS — X58XXXA Exposure to other specified factors, initial encounter: Secondary | ICD-10-CM | POA: Diagnosis not present

## 2024-06-29 DIAGNOSIS — F03B3 Unspecified dementia, moderate, with mood disturbance: Secondary | ICD-10-CM | POA: Diagnosis not present

## 2024-06-29 DIAGNOSIS — S72101D Unspecified trochanteric fracture of right femur, subsequent encounter for closed fracture with routine healing: Secondary | ICD-10-CM | POA: Diagnosis not present

## 2024-06-29 DIAGNOSIS — I639 Cerebral infarction, unspecified: Secondary | ICD-10-CM | POA: Diagnosis not present

## 2024-06-29 DIAGNOSIS — R1111 Vomiting without nausea: Secondary | ICD-10-CM | POA: Diagnosis not present

## 2024-06-29 DIAGNOSIS — E854 Organ-limited amyloidosis: Secondary | ICD-10-CM | POA: Diagnosis not present

## 2024-06-29 DIAGNOSIS — R404 Transient alteration of awareness: Secondary | ICD-10-CM | POA: Diagnosis not present

## 2024-06-29 DIAGNOSIS — F32A Depression, unspecified: Secondary | ICD-10-CM | POA: Diagnosis not present

## 2024-06-29 DIAGNOSIS — R41841 Cognitive communication deficit: Secondary | ICD-10-CM | POA: Diagnosis not present

## 2024-06-29 DIAGNOSIS — M810 Age-related osteoporosis without current pathological fracture: Secondary | ICD-10-CM | POA: Diagnosis not present

## 2024-06-29 DIAGNOSIS — R0902 Hypoxemia: Secondary | ICD-10-CM | POA: Diagnosis not present

## 2024-06-29 DIAGNOSIS — Z9842 Cataract extraction status, left eye: Secondary | ICD-10-CM | POA: Diagnosis not present

## 2024-06-29 DIAGNOSIS — S52572D Other intraarticular fracture of lower end of left radius, subsequent encounter for closed fracture with routine healing: Secondary | ICD-10-CM | POA: Diagnosis not present

## 2024-06-29 DIAGNOSIS — Z743 Need for continuous supervision: Secondary | ICD-10-CM | POA: Diagnosis not present

## 2024-06-29 DIAGNOSIS — Z66 Do not resuscitate: Secondary | ICD-10-CM | POA: Diagnosis not present

## 2024-06-29 DIAGNOSIS — I629 Nontraumatic intracranial hemorrhage, unspecified: Secondary | ICD-10-CM | POA: Diagnosis not present

## 2024-06-29 DIAGNOSIS — J9602 Acute respiratory failure with hypercapnia: Secondary | ICD-10-CM | POA: Diagnosis not present

## 2024-06-29 DIAGNOSIS — S52612A Displaced fracture of left ulna styloid process, initial encounter for closed fracture: Secondary | ICD-10-CM | POA: Diagnosis not present

## 2024-06-29 DIAGNOSIS — I615 Nontraumatic intracerebral hemorrhage, intraventricular: Secondary | ICD-10-CM | POA: Diagnosis not present

## 2024-06-29 DIAGNOSIS — D62 Acute posthemorrhagic anemia: Secondary | ICD-10-CM | POA: Diagnosis not present

## 2024-06-29 DIAGNOSIS — S066XAA Traumatic subarachnoid hemorrhage with loss of consciousness status unknown, initial encounter: Secondary | ICD-10-CM | POA: Diagnosis not present

## 2024-06-29 DIAGNOSIS — R1310 Dysphagia, unspecified: Secondary | ICD-10-CM | POA: Diagnosis not present

## 2024-06-29 DIAGNOSIS — F419 Anxiety disorder, unspecified: Secondary | ICD-10-CM | POA: Diagnosis not present

## 2024-06-29 DIAGNOSIS — E785 Hyperlipidemia, unspecified: Secondary | ICD-10-CM | POA: Diagnosis not present

## 2024-06-29 DIAGNOSIS — R4182 Altered mental status, unspecified: Secondary | ICD-10-CM | POA: Diagnosis not present

## 2024-06-29 DIAGNOSIS — Z860100 Personal history of colon polyps, unspecified: Secondary | ICD-10-CM | POA: Diagnosis not present

## 2024-06-29 DIAGNOSIS — X58XXXD Exposure to other specified factors, subsequent encounter: Secondary | ICD-10-CM | POA: Diagnosis not present

## 2024-06-29 DIAGNOSIS — R296 Repeated falls: Secondary | ICD-10-CM | POA: Diagnosis not present

## 2024-06-29 NOTE — Telephone Encounter (Signed)
 Pharmacy Patient Advocate Encounter  Received notification from [facility] that Prior Authorization for Doxepin  HCl 10MG  capsules has been APPROVED from 12/14/2023 to 12/12/2024   PA #/Case ID/Reference #: [id number]

## 2024-07-01 DIAGNOSIS — S52502A Unspecified fracture of the lower end of left radius, initial encounter for closed fracture: Secondary | ICD-10-CM | POA: Diagnosis not present

## 2024-07-01 DIAGNOSIS — I629 Nontraumatic intracranial hemorrhage, unspecified: Secondary | ICD-10-CM | POA: Diagnosis not present

## 2024-07-04 DIAGNOSIS — S52612A Displaced fracture of left ulna styloid process, initial encounter for closed fracture: Secondary | ICD-10-CM | POA: Diagnosis not present

## 2024-07-04 DIAGNOSIS — M8588 Other specified disorders of bone density and structure, other site: Secondary | ICD-10-CM | POA: Diagnosis not present

## 2024-07-04 DIAGNOSIS — X58XXXA Exposure to other specified factors, initial encounter: Secondary | ICD-10-CM | POA: Diagnosis not present

## 2024-07-04 DIAGNOSIS — F331 Major depressive disorder, recurrent, moderate: Secondary | ICD-10-CM | POA: Diagnosis not present

## 2024-07-04 DIAGNOSIS — M1812 Unilateral primary osteoarthritis of first carpometacarpal joint, left hand: Secondary | ICD-10-CM | POA: Diagnosis not present

## 2024-07-04 DIAGNOSIS — X58XXXD Exposure to other specified factors, subsequent encounter: Secondary | ICD-10-CM | POA: Diagnosis not present

## 2024-07-04 DIAGNOSIS — S52572D Other intraarticular fracture of lower end of left radius, subsequent encounter for closed fracture with routine healing: Secondary | ICD-10-CM | POA: Diagnosis not present

## 2024-07-04 DIAGNOSIS — S52502D Unspecified fracture of the lower end of left radius, subsequent encounter for closed fracture with routine healing: Secondary | ICD-10-CM | POA: Diagnosis not present

## 2024-07-05 DIAGNOSIS — S72111D Displaced fracture of greater trochanter of right femur, subsequent encounter for closed fracture with routine healing: Secondary | ICD-10-CM | POA: Diagnosis not present

## 2024-07-05 DIAGNOSIS — E785 Hyperlipidemia, unspecified: Secondary | ICD-10-CM | POA: Diagnosis not present

## 2024-07-05 DIAGNOSIS — S72111A Displaced fracture of greater trochanter of right femur, initial encounter for closed fracture: Secondary | ICD-10-CM | POA: Diagnosis not present

## 2024-07-05 DIAGNOSIS — S52502A Unspecified fracture of the lower end of left radius, initial encounter for closed fracture: Secondary | ICD-10-CM | POA: Diagnosis not present

## 2024-07-05 DIAGNOSIS — F419 Anxiety disorder, unspecified: Secondary | ICD-10-CM | POA: Diagnosis not present

## 2024-07-05 DIAGNOSIS — F32A Depression, unspecified: Secondary | ICD-10-CM | POA: Diagnosis not present

## 2024-07-06 DIAGNOSIS — S52502A Unspecified fracture of the lower end of left radius, initial encounter for closed fracture: Secondary | ICD-10-CM | POA: Diagnosis not present

## 2024-07-06 DIAGNOSIS — I629 Nontraumatic intracranial hemorrhage, unspecified: Secondary | ICD-10-CM | POA: Diagnosis not present

## 2024-07-10 DIAGNOSIS — S72111A Displaced fracture of greater trochanter of right femur, initial encounter for closed fracture: Secondary | ICD-10-CM | POA: Diagnosis not present

## 2024-07-10 DIAGNOSIS — E785 Hyperlipidemia, unspecified: Secondary | ICD-10-CM | POA: Diagnosis not present

## 2024-07-10 DIAGNOSIS — S72111D Displaced fracture of greater trochanter of right femur, subsequent encounter for closed fracture with routine healing: Secondary | ICD-10-CM | POA: Diagnosis not present

## 2024-07-10 DIAGNOSIS — F419 Anxiety disorder, unspecified: Secondary | ICD-10-CM | POA: Diagnosis not present

## 2024-07-11 DIAGNOSIS — S52502A Unspecified fracture of the lower end of left radius, initial encounter for closed fracture: Secondary | ICD-10-CM | POA: Diagnosis not present

## 2024-07-11 DIAGNOSIS — I639 Cerebral infarction, unspecified: Secondary | ICD-10-CM | POA: Diagnosis not present

## 2024-07-16 DIAGNOSIS — S52502A Unspecified fracture of the lower end of left radius, initial encounter for closed fracture: Secondary | ICD-10-CM | POA: Diagnosis not present

## 2024-07-16 DIAGNOSIS — M6281 Muscle weakness (generalized): Secondary | ICD-10-CM | POA: Diagnosis not present

## 2024-07-16 DIAGNOSIS — I629 Nontraumatic intracranial hemorrhage, unspecified: Secondary | ICD-10-CM | POA: Diagnosis not present

## 2024-07-19 DIAGNOSIS — S022XXA Fracture of nasal bones, initial encounter for closed fracture: Secondary | ICD-10-CM | POA: Diagnosis not present

## 2024-07-19 DIAGNOSIS — R41841 Cognitive communication deficit: Secondary | ICD-10-CM | POA: Diagnosis not present

## 2024-07-19 DIAGNOSIS — S72101D Unspecified trochanteric fracture of right femur, subsequent encounter for closed fracture with routine healing: Secondary | ICD-10-CM | POA: Diagnosis not present

## 2024-07-19 DIAGNOSIS — R262 Difficulty in walking, not elsewhere classified: Secondary | ICD-10-CM | POA: Diagnosis not present

## 2024-07-20 DIAGNOSIS — S72111D Displaced fracture of greater trochanter of right femur, subsequent encounter for closed fracture with routine healing: Secondary | ICD-10-CM | POA: Diagnosis not present

## 2024-07-20 DIAGNOSIS — F32A Depression, unspecified: Secondary | ICD-10-CM | POA: Diagnosis not present

## 2024-07-20 DIAGNOSIS — E785 Hyperlipidemia, unspecified: Secondary | ICD-10-CM | POA: Diagnosis not present

## 2024-07-20 DIAGNOSIS — S72111A Displaced fracture of greater trochanter of right femur, initial encounter for closed fracture: Secondary | ICD-10-CM | POA: Diagnosis not present

## 2024-07-20 DIAGNOSIS — F419 Anxiety disorder, unspecified: Secondary | ICD-10-CM | POA: Diagnosis not present

## 2024-07-20 DIAGNOSIS — S52502A Unspecified fracture of the lower end of left radius, initial encounter for closed fracture: Secondary | ICD-10-CM | POA: Diagnosis not present

## 2024-07-26 DIAGNOSIS — S72111A Displaced fracture of greater trochanter of right femur, initial encounter for closed fracture: Secondary | ICD-10-CM | POA: Diagnosis not present

## 2024-07-26 DIAGNOSIS — I10 Essential (primary) hypertension: Secondary | ICD-10-CM | POA: Diagnosis not present

## 2024-07-26 DIAGNOSIS — S066X0S Traumatic subarachnoid hemorrhage without loss of consciousness, sequela: Secondary | ICD-10-CM | POA: Diagnosis not present

## 2024-07-26 DIAGNOSIS — S72111D Displaced fracture of greater trochanter of right femur, subsequent encounter for closed fracture with routine healing: Secondary | ICD-10-CM | POA: Diagnosis not present

## 2024-07-26 DIAGNOSIS — F32A Depression, unspecified: Secondary | ICD-10-CM | POA: Diagnosis not present

## 2024-07-26 DIAGNOSIS — F419 Anxiety disorder, unspecified: Secondary | ICD-10-CM | POA: Diagnosis not present

## 2024-07-26 DIAGNOSIS — S52502A Unspecified fracture of the lower end of left radius, initial encounter for closed fracture: Secondary | ICD-10-CM | POA: Diagnosis not present

## 2024-07-26 DIAGNOSIS — E785 Hyperlipidemia, unspecified: Secondary | ICD-10-CM | POA: Diagnosis not present

## 2024-07-26 DIAGNOSIS — M6281 Muscle weakness (generalized): Secondary | ICD-10-CM | POA: Diagnosis not present

## 2024-07-30 ENCOUNTER — Ambulatory Visit: Admitting: Family Medicine

## 2024-08-01 DIAGNOSIS — S52572D Other intraarticular fracture of lower end of left radius, subsequent encounter for closed fracture with routine healing: Secondary | ICD-10-CM | POA: Diagnosis not present

## 2024-08-01 DIAGNOSIS — S52502D Unspecified fracture of the lower end of left radius, subsequent encounter for closed fracture with routine healing: Secondary | ICD-10-CM | POA: Diagnosis not present

## 2024-08-03 DIAGNOSIS — F32A Depression, unspecified: Secondary | ICD-10-CM | POA: Diagnosis not present

## 2024-08-03 DIAGNOSIS — I10 Essential (primary) hypertension: Secondary | ICD-10-CM | POA: Diagnosis not present

## 2024-08-03 DIAGNOSIS — S72111D Displaced fracture of greater trochanter of right femur, subsequent encounter for closed fracture with routine healing: Secondary | ICD-10-CM | POA: Diagnosis not present

## 2024-08-03 DIAGNOSIS — F419 Anxiety disorder, unspecified: Secondary | ICD-10-CM | POA: Diagnosis not present

## 2024-08-03 DIAGNOSIS — S52502A Unspecified fracture of the lower end of left radius, initial encounter for closed fracture: Secondary | ICD-10-CM | POA: Diagnosis not present

## 2024-08-03 DIAGNOSIS — E785 Hyperlipidemia, unspecified: Secondary | ICD-10-CM | POA: Diagnosis not present

## 2024-08-03 DIAGNOSIS — S72111A Displaced fracture of greater trochanter of right femur, initial encounter for closed fracture: Secondary | ICD-10-CM | POA: Diagnosis not present

## 2024-08-03 DIAGNOSIS — S066X0S Traumatic subarachnoid hemorrhage without loss of consciousness, sequela: Secondary | ICD-10-CM | POA: Diagnosis not present

## 2024-08-03 DIAGNOSIS — M6281 Muscle weakness (generalized): Secondary | ICD-10-CM | POA: Diagnosis not present

## 2024-08-07 DIAGNOSIS — F419 Anxiety disorder, unspecified: Secondary | ICD-10-CM | POA: Diagnosis not present

## 2024-08-07 DIAGNOSIS — S72111A Displaced fracture of greater trochanter of right femur, initial encounter for closed fracture: Secondary | ICD-10-CM | POA: Diagnosis not present

## 2024-08-07 DIAGNOSIS — E785 Hyperlipidemia, unspecified: Secondary | ICD-10-CM | POA: Diagnosis not present

## 2024-08-07 DIAGNOSIS — S72111D Displaced fracture of greater trochanter of right femur, subsequent encounter for closed fracture with routine healing: Secondary | ICD-10-CM | POA: Diagnosis not present

## 2024-08-09 DIAGNOSIS — I10 Essential (primary) hypertension: Secondary | ICD-10-CM | POA: Diagnosis not present

## 2024-08-09 DIAGNOSIS — E7800 Pure hypercholesterolemia, unspecified: Secondary | ICD-10-CM | POA: Diagnosis not present

## 2024-08-09 DIAGNOSIS — I619 Nontraumatic intracerebral hemorrhage, unspecified: Secondary | ICD-10-CM | POA: Diagnosis not present

## 2024-08-09 DIAGNOSIS — I680 Cerebral amyloid angiopathy: Secondary | ICD-10-CM | POA: Diagnosis not present

## 2024-08-09 DIAGNOSIS — M810 Age-related osteoporosis without current pathological fracture: Secondary | ICD-10-CM | POA: Diagnosis not present

## 2024-08-09 DIAGNOSIS — J9602 Acute respiratory failure with hypercapnia: Secondary | ICD-10-CM | POA: Diagnosis not present

## 2024-08-09 DIAGNOSIS — Z515 Encounter for palliative care: Secondary | ICD-10-CM | POA: Diagnosis not present

## 2024-08-09 DIAGNOSIS — Z860100 Personal history of colon polyps, unspecified: Secondary | ICD-10-CM | POA: Diagnosis not present

## 2024-08-09 DIAGNOSIS — I615 Nontraumatic intracerebral hemorrhage, intraventricular: Secondary | ICD-10-CM | POA: Diagnosis not present

## 2024-08-09 DIAGNOSIS — R404 Transient alteration of awareness: Secondary | ICD-10-CM | POA: Diagnosis not present

## 2024-08-09 DIAGNOSIS — S72111A Displaced fracture of greater trochanter of right femur, initial encounter for closed fracture: Secondary | ICD-10-CM | POA: Diagnosis not present

## 2024-08-09 DIAGNOSIS — S52502A Unspecified fracture of the lower end of left radius, initial encounter for closed fracture: Secondary | ICD-10-CM | POA: Diagnosis not present

## 2024-08-09 DIAGNOSIS — I610 Nontraumatic intracerebral hemorrhage in hemisphere, subcortical: Secondary | ICD-10-CM | POA: Diagnosis not present

## 2024-08-09 DIAGNOSIS — Z9841 Cataract extraction status, right eye: Secondary | ICD-10-CM | POA: Diagnosis not present

## 2024-08-09 DIAGNOSIS — F32A Depression, unspecified: Secondary | ICD-10-CM | POA: Diagnosis not present

## 2024-08-09 DIAGNOSIS — X58XXXA Exposure to other specified factors, initial encounter: Secondary | ICD-10-CM | POA: Diagnosis not present

## 2024-08-09 DIAGNOSIS — Z9842 Cataract extraction status, left eye: Secondary | ICD-10-CM | POA: Diagnosis not present

## 2024-08-09 DIAGNOSIS — F03B4 Unspecified dementia, moderate, with anxiety: Secondary | ICD-10-CM | POA: Diagnosis not present

## 2024-08-09 DIAGNOSIS — I2510 Atherosclerotic heart disease of native coronary artery without angina pectoris: Secondary | ICD-10-CM | POA: Diagnosis not present

## 2024-08-09 DIAGNOSIS — Z961 Presence of intraocular lens: Secondary | ICD-10-CM | POA: Diagnosis not present

## 2024-08-09 DIAGNOSIS — G9341 Metabolic encephalopathy: Secondary | ICD-10-CM | POA: Diagnosis not present

## 2024-08-09 DIAGNOSIS — J9601 Acute respiratory failure with hypoxia: Secondary | ICD-10-CM | POA: Diagnosis not present

## 2024-08-09 DIAGNOSIS — E785 Hyperlipidemia, unspecified: Secondary | ICD-10-CM | POA: Diagnosis not present

## 2024-08-09 DIAGNOSIS — R296 Repeated falls: Secondary | ICD-10-CM | POA: Diagnosis not present

## 2024-08-09 DIAGNOSIS — G935 Compression of brain: Secondary | ICD-10-CM | POA: Diagnosis not present

## 2024-08-09 DIAGNOSIS — E854 Organ-limited amyloidosis: Secondary | ICD-10-CM | POA: Diagnosis not present

## 2024-08-09 DIAGNOSIS — Z66 Do not resuscitate: Secondary | ICD-10-CM | POA: Diagnosis not present

## 2024-08-09 DIAGNOSIS — F03B3 Unspecified dementia, moderate, with mood disturbance: Secondary | ICD-10-CM | POA: Diagnosis not present

## 2024-08-09 DIAGNOSIS — R4182 Altered mental status, unspecified: Secondary | ICD-10-CM | POA: Diagnosis not present

## 2024-08-09 DIAGNOSIS — R1111 Vomiting without nausea: Secondary | ICD-10-CM | POA: Diagnosis not present

## 2024-08-09 DIAGNOSIS — F419 Anxiety disorder, unspecified: Secondary | ICD-10-CM | POA: Diagnosis not present

## 2024-08-09 DIAGNOSIS — Z452 Encounter for adjustment and management of vascular access device: Secondary | ICD-10-CM | POA: Diagnosis not present

## 2024-08-09 DIAGNOSIS — R0902 Hypoxemia: Secondary | ICD-10-CM | POA: Diagnosis not present

## 2024-08-09 DIAGNOSIS — S72111D Displaced fracture of greater trochanter of right femur, subsequent encounter for closed fracture with routine healing: Secondary | ICD-10-CM | POA: Diagnosis not present

## 2024-08-13 NOTE — Nursing Note (Signed)
 Patient demonstrated asystole on monitor, RN notified provider who declared cardiac time of death on 08-20-2024 at 1926. Family at bedside and updated. Life share notified, house supervisor notified. Patient taken to [facility] by RN.

## 2024-08-13 NOTE — Progress Notes (Addendum)
 Case Management Update  Date: 09/01/2024   Time: 10:35 AM   Patient Type: Inpatient      Post Acute Placement Status: Pending review. Reviewing alternative options. See narrative comments for further details.   Case Management Coordination Status: Coordination In-Progress    Anticipated Discharge Location: Other (see comment)HIP   Family in agreement with Hospice referral with [facility] for HIP.  Patient Choice Preference sheet completed and placed in pt's chart. Referral called to [facility] [phone].  Sotero Pilgrim RN BSN Case Manager [phone]     Pt is HIP with [facility] @1325 . UR notified.  Sotero Pilgrim RN BSN Case Manager [phone]

## 2024-08-13 DEATH — deceased

## 2024-08-15 ENCOUNTER — Telehealth: Payer: Self-pay

## 2024-08-15 NOTE — Telephone Encounter (Signed)
 Caller Name: [facility]  Date of death: August 26, 2024 Place of death:   Time of death: 1926-09-05               Funeral Home:  Case #

## 2024-08-15 NOTE — Telephone Encounter (Signed)
 Copied from CRM #[id number]. Topic: General - Other >> Aug 15, 2024  8:48 AM Burnard DEL wrote: Reason for CRM: [facility] called to let provider know that patient passed away on 2024/08/29 at 7:26pm.

## 2024-09-12 NOTE — Progress Notes (Signed)
 Case Management Discharge Note        CSN: [id number] DOB: 04-10-1946 Service: Neurology Location: B509/A  Patient Class: Inpatient  DC Disposition: : Expired  Discharge DC Disposition: : Expired  Discharge Referrals Case closed, patient/family agree with disposition plan: Yes       Case Management Coordination Status: Coordination Complete    Sotero Pilgrim RN BSN Case Manager [phone]
